# Patient Record
Sex: Female | Born: 1960 | Race: Black or African American | Hispanic: No | Marital: Married | Smoking: Former smoker
Health system: Southern US, Community
[De-identification: ages and names within clinical notes are randomized; demographics above are authoritative.]

## PROBLEM LIST (undated history)

## (undated) DIAGNOSIS — R945 Abnormal results of liver function studies: Secondary | ICD-10-CM

## (undated) DIAGNOSIS — E785 Hyperlipidemia, unspecified: Secondary | ICD-10-CM

## (undated) DIAGNOSIS — R7989 Other specified abnormal findings of blood chemistry: Secondary | ICD-10-CM

## (undated) DIAGNOSIS — R7309 Other abnormal glucose: Secondary | ICD-10-CM

## (undated) DIAGNOSIS — E781 Pure hyperglyceridemia: Secondary | ICD-10-CM

## (undated) DIAGNOSIS — G4730 Sleep apnea, unspecified: Secondary | ICD-10-CM

## (undated) DIAGNOSIS — E876 Hypokalemia: Secondary | ICD-10-CM

## (undated) DIAGNOSIS — I10 Essential (primary) hypertension: Secondary | ICD-10-CM

## (undated) DIAGNOSIS — R0602 Shortness of breath: Secondary | ICD-10-CM

## (undated) HISTORY — DX: Essential (primary) hypertension: I10

## (undated) HISTORY — DX: Abnormal results of liver function studies: R94.5

## (undated) HISTORY — DX: Hyperlipidemia, unspecified: E78.5

## (undated) HISTORY — DX: Pure hyperglyceridemia: E78.1

## (undated) HISTORY — DX: Other specified abnormal findings of blood chemistry: R79.89

## (undated) HISTORY — DX: Hypokalemia: E87.6

## (undated) HISTORY — DX: Other abnormal glucose: R73.09

## (undated) HISTORY — DX: Sleep apnea, unspecified: G47.30

---

## 2001-10-16 ENCOUNTER — Other Ambulatory Visit: Admission: RE | Admit: 2001-10-16 | Discharge: 2001-10-16 | Payer: Self-pay | Admitting: *Deleted

## 2003-04-09 ENCOUNTER — Other Ambulatory Visit: Admission: RE | Admit: 2003-04-09 | Discharge: 2003-04-09 | Payer: Self-pay | Admitting: Gynecology

## 2004-04-22 ENCOUNTER — Other Ambulatory Visit: Admission: RE | Admit: 2004-04-22 | Discharge: 2004-04-22 | Payer: Self-pay | Admitting: Gynecology

## 2004-06-10 ENCOUNTER — Ambulatory Visit (HOSPITAL_COMMUNITY): Admission: RE | Admit: 2004-06-10 | Discharge: 2004-06-10 | Payer: Self-pay | Admitting: Gastroenterology

## 2005-08-31 ENCOUNTER — Other Ambulatory Visit: Admission: RE | Admit: 2005-08-31 | Discharge: 2005-08-31 | Payer: Self-pay | Admitting: Obstetrics and Gynecology

## 2011-03-24 ENCOUNTER — Ambulatory Visit
Admission: RE | Admit: 2011-03-24 | Discharge: 2011-03-24 | Disposition: A | Discharge status: Home / Self Care | Payer: BC Managed Care – PPO | Source: Ambulatory Visit | Attending: Emergency Medicine | Admitting: Emergency Medicine

## 2011-03-24 ENCOUNTER — Other Ambulatory Visit: Payer: Self-pay | Admitting: Emergency Medicine

## 2011-03-24 DIAGNOSIS — R102 Pelvic and perineal pain: Secondary | ICD-10-CM

## 2011-04-06 LAB — HM PAP SMEAR: HM Pap smear: NORMAL

## 2011-04-24 LAB — LIPID PANEL
Cholesterol: 158 mg/dL (ref 0–200)
HDL: 39 mg/dL (ref 35–70)
Triglycerides: 175 mg/dL — AB (ref 40–160)

## 2011-04-24 LAB — HEPATIC FUNCTION PANEL: Alkaline Phosphatase: 92 U/L (ref 25–125)

## 2011-07-04 ENCOUNTER — Other Ambulatory Visit: Payer: Self-pay | Admitting: Emergency Medicine

## 2011-07-06 ENCOUNTER — Other Ambulatory Visit (INDEPENDENT_AMBULATORY_CARE_PROVIDER_SITE_OTHER): Payer: Self-pay | Admitting: General Surgery

## 2011-07-06 ENCOUNTER — Ambulatory Visit (INDEPENDENT_AMBULATORY_CARE_PROVIDER_SITE_OTHER): Payer: BC Managed Care – PPO | Admitting: General Surgery

## 2011-07-06 ENCOUNTER — Encounter: Payer: Self-pay | Admitting: *Deleted

## 2011-07-06 ENCOUNTER — Encounter: Payer: BC Managed Care – PPO | Attending: Emergency Medicine | Admitting: *Deleted

## 2011-07-06 ENCOUNTER — Encounter (INDEPENDENT_AMBULATORY_CARE_PROVIDER_SITE_OTHER): Payer: Self-pay | Admitting: General Surgery

## 2011-07-06 ENCOUNTER — Ambulatory Visit: Payer: Self-pay | Admitting: *Deleted

## 2011-07-06 DIAGNOSIS — E669 Obesity, unspecified: Secondary | ICD-10-CM | POA: Insufficient documentation

## 2011-07-06 DIAGNOSIS — Z713 Dietary counseling and surveillance: Secondary | ICD-10-CM | POA: Insufficient documentation

## 2011-07-06 NOTE — Progress Notes (Signed)
Subjective:   Obesity  Patient ID: Madison Alvarado, female   DOB: 28-Mar-1961, 50 y.o.   MRN: [id number]  HPI Patient is a pleasant 50 year old female here for consideration for surgical treatment for morbid obesity. She states that she has had progressive struggles with her weight through her entire adult life. She has been through numerous efforts at diet and exercise programs to lose weight and has lost up to 40 pounds at a time but then experiences progressive weight regain. She is considering surgery due to concerns about her deteriorating health. She has developed multiple comorbidities directly related to her weight as detailed below. She sees her health continuing to deteriorate if she does not get control of her weight and is very concerned about this. She has been to our initial information seminar and is opened options but at this point feels gastric bypass would fit her situation best.  Past Medical History  Diagnosis Date  . Asthma   . Hyperlipidemia   . Hypertension    History reviewed. No pertinent past surgical history. Current Outpatient Prescriptions  Medication Sig Dispense Refill  . albuterol (PROVENTIL) (2.5 MG/3ML) 0.083% nebulizer solution Take 2.5 mg by nebulization as needed.        Marland Kitchen amLODipine (NORVASC) 5 MG tablet       . aspirin 81 MG tablet Take 81 mg by mouth daily.        Marland Kitchen atenolol (TENORMIN) 100 MG tablet       . beclomethasone (QVAR) 40 MCG/ACT inhaler Inhale 2 puffs into the lungs daily.        . calcium carbonate 200 MG capsule Take 600 mg by mouth daily.        . chlorthalidone (HYGROTON) 25 MG tablet       . cholecalciferol (VITAMIN D) 400 UNITS TABS Take 1,000 Units by mouth daily.        Marland Kitchen ESZOPICLONE 3 MG tablet       . fish oil-omega-3 fatty acids 1000 MG capsule Take 2 g by mouth 3 (three) times daily.        Marland Kitchen ibuprofen (ADVIL,MOTRIN) 600 MG tablet       . NIASPAN 500 MG CR tablet       . potassium chloride SA (K-DUR,KLOR-CON) 20 MEQ tablet Take 20  mEq by mouth daily.        . pravastatin (PRAVACHOL) 40 MG tablet       . vitamin E 200 UNIT capsule Take 200 Units by mouth daily.         No Known Allergies History  Substance Use Topics  . Smoking status: Former Smoker    Quit date: 06/16/2011  . Smokeless tobacco: Never Used  . Alcohol Use: No    Review of Systems  Constitutional: Positive for fatigue.  HENT: Negative.   Eyes: Negative.   Respiratory: Positive for shortness of breath and wheezing.   Cardiovascular: Negative.   Gastrointestinal: Negative.   Musculoskeletal: Positive for arthralgias.  Hematological: Negative.        Objective:   Physical Exam General: Morbidly obese African American female in no distress Skin: Warm and dry without rash or infection HEENT: No palpable masses or thyromegaly. Sclera nonicteric. Oropharynx clear. Pupils equal round and reactive. Lungs: Breath sounds clear and equal Lymph nodes: No cervical, supraclavicular, or equal nodes palpable Cardiovascular: Regular rate and rhythm without murmur. No JVD or peripheral edema. Peripheral pulses intact. Abdomen: Obese. Soft and nontender without palpable masses, organomegaly, or hernias. Extremities:  No joint swelling or edema or chronic venous stasis changes Neurologic: Alert and fully oriented. Gait normal.    Assessment:     50 year old female with progressive morbid obesity unresponsive to multiple efforts at medical management. She presents at a BMI of 41.27 with multiple comorbidities including obstructive sleep apnea requiring CPAP, hypertension, dyslipidemia, borderline diabetes diet controlled, and asthma. She has significant potential benefits if we can get her down to a healthier weight. We discussed surgical options available including lap band, sleeve gastrectomy, and gastric bypass. These operations were compared regarding results and potential complications. She continues to feel that gastric bypass would be her best choice. We  discussed this procedure in detail including its nature and expected recovery, expected results, risks of anesthetic complications, bleeding, leak and infection, pulmonary embolus, risk of death, and long-term complications of stenosis, ulcer, weight regain, metabolic and nutritionalt nutritional deficiencies, internal hernia, gallstones and others. She was given a complete consent form to review.    Plan:     We will proceed with preoperative workup including psychologic and nutrition evaluation, routine blood work, H. pylori testing, bone density. She is already scheduled for an abdominal ultrasound by her PCP. I will see her back following these studies.

## 2011-07-06 NOTE — Progress Notes (Signed)
  Medical Nutrition Therapy:  Appt start time: 1100 end time:  1230.   Assessment:  Primary concerns today: Nutrition counseling for pre-diabetes, weight loss and hyperlipidemia. Patient states she has started water aerobics at the [facility] 4 evenings per week and enjoys that quite a bit! She has modified her beverage intake away from regular sodas to diet and green tea. 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   MEDICATIONS: see list.   DIETARY INTAKE:  Usual eating pattern includes 2 meals and 1-2 snacks per day.  Everyday foods include good variety of all food groups.  Avoided foods include regular soda and fried foods now.    24-hr recall:  B ( AM): skips  Snk ( AM): banana  L ( PM): left overs or sit down restaurant meal with lean meat, starch and vegetables Snk ( PM): occasional fruit D ( PM): meat, starch and usually a vegetable  Snk ( PM): none stated Beverages: diet soda, diet green tea or water. Occasionally a fruit juice  Usual physical activity: water aerobics for 45 minutes 4 nights a week for the past 2 weeks  Estimated energy needs: 1600 calories 180 g carbohydrates 120 g protein 44 g fat  Progress Towards Goal(s):  In progress.   Nutritional Diagnosis:  Worth-3.3 Overweight/obesity As related to pre-diabetes, hypercholesterolemia and HTN.  As evidenced by BMI of 41.3.    Intervention:  Nutrition counseling provided for consistent carb intake of 3 Carb Choices per meal, 0-1/snack for total of 1600 calories to allow weight loss of 1-2 pounds per week. Also recommended frequent choice of lean meats, low fat dairy products and vegetable oil dressings. Encouraged continuation of water aerobics, and provided instruction for Accu-Chek Nano meter kit. (Lot # [id number], Exp date 09/04/2012) Suggested she test twice a week, alternating between pre and post meal as long as results were within target ranges. If BG increases, she should contact me or her MD for  instructions.  Handouts given during visit include:  Where Do I Begin with Type 2 Diabetes by ADA  Carb Counting and Meal Planning by NovoNordisk  Monitoring/Evaluation:  Dietary intake, exercise, self monitoring of BG logs, and body weight in 4 week(s).

## 2011-07-07 ENCOUNTER — Ambulatory Visit
Admission: RE | Admit: 2011-07-07 | Discharge: 2011-07-07 | Disposition: A | Discharge status: Home / Self Care | Payer: BC Managed Care – PPO | Source: Ambulatory Visit | Attending: Emergency Medicine | Admitting: Emergency Medicine

## 2011-07-07 LAB — CBC WITH DIFFERENTIAL/PLATELET
Basophils Relative: 1 % (ref 0–1)
Eosinophils Absolute: 0.2 10*3/uL (ref 0.0–0.7)
Eosinophils Relative: 4 % (ref 0–5)
HCT: 37.1 % (ref 36.0–46.0)
Hemoglobin: 12.5 g/dL (ref 12.0–15.0)
Lymphs Abs: 2.6 10*3/uL (ref 0.7–4.0)
MCH: 30.4 pg (ref 26.0–34.0)
MCHC: 33.7 g/dL (ref 30.0–36.0)
MCV: 90.3 fL (ref 78.0–100.0)
Monocytes Absolute: 0.4 10*3/uL (ref 0.1–1.0)
Monocytes Relative: 9 % (ref 3–12)
RBC: 4.11 MIL/uL (ref 3.87–5.11)

## 2011-07-07 LAB — T4: T4, Total: 7.1 ug/dL (ref 5.0–12.5)

## 2011-07-07 LAB — TSH: TSH: 1.998 u[IU]/mL (ref 0.350–4.500)

## 2011-07-07 LAB — LIPID PANEL
Cholesterol: 156 mg/dL (ref 0–200)
HDL: 38 mg/dL — ABNORMAL LOW (ref >39)

## 2011-07-07 LAB — COMPREHENSIVE METABOLIC PANEL
BUN: 12 mg/dL (ref 6–23)
CO2: 25 mEq/L (ref 19–32)
Glucose, Bld: 103 mg/dL — ABNORMAL HIGH (ref 70–99)
Sodium: 140 mEq/L (ref 135–145)
Total Bilirubin: 0.5 mg/dL (ref 0.3–1.2)
Total Protein: 7.3 g/dL (ref 6.0–8.3)

## 2011-07-08 ENCOUNTER — Other Ambulatory Visit (INDEPENDENT_AMBULATORY_CARE_PROVIDER_SITE_OTHER): Payer: Self-pay | Admitting: General Surgery

## 2011-07-08 LAB — HEMOGLOBIN A1C
Hgb A1c MFr Bld: 5.9 % — ABNORMAL HIGH
Mean Plasma Glucose: 123 mg/dL — ABNORMAL HIGH

## 2011-07-13 ENCOUNTER — Ambulatory Visit (HOSPITAL_COMMUNITY): Payer: BC Managed Care – PPO

## 2011-07-13 ENCOUNTER — Encounter (HOSPITAL_COMMUNITY)
Admission: RE | Disposition: A | Discharge status: Home / Self Care | Payer: Self-pay | Source: Ambulatory Visit | Attending: General Surgery

## 2011-07-13 ENCOUNTER — Ambulatory Visit (HOSPITAL_COMMUNITY)
Admission: RE | Admit: 2011-07-13 | Discharge: 2011-07-13 | Disposition: A | Discharge status: Home / Self Care | Payer: BC Managed Care – PPO | Source: Ambulatory Visit | Attending: General Surgery | Admitting: General Surgery

## 2011-07-13 ENCOUNTER — Other Ambulatory Visit: Payer: Self-pay

## 2011-07-13 DIAGNOSIS — Z01818 Encounter for other preprocedural examination: Secondary | ICD-10-CM | POA: Insufficient documentation

## 2011-07-13 HISTORY — PX: BREATH TEK H PYLORI: SHX5422

## 2011-07-13 SURGERY — BREATH TEST, FOR HELICOBACTER PYLORI
Anesthesia: Choice

## 2011-07-14 ENCOUNTER — Encounter (HOSPITAL_COMMUNITY): Payer: Self-pay

## 2011-07-18 ENCOUNTER — Encounter: Payer: Self-pay | Admitting: *Deleted

## 2011-07-18 ENCOUNTER — Encounter: Payer: BC Managed Care – PPO | Attending: Emergency Medicine | Admitting: *Deleted

## 2011-07-18 DIAGNOSIS — Z713 Dietary counseling and surveillance: Secondary | ICD-10-CM | POA: Insufficient documentation

## 2011-07-18 DIAGNOSIS — E669 Obesity, unspecified: Secondary | ICD-10-CM | POA: Insufficient documentation

## 2011-07-18 NOTE — Progress Notes (Signed)
  Pre-Op Assessment Visit: Pre-Operative Gastric Bypass Surgery  Medical Nutrition Therapy:  Appt start time: 0830 end time:  0930.  Patient was seen on 07/18/2011 for Pre-Operative Gastric Bypass Nutrition Assessment. Assessment and letter of approval faxed to [facility] Surgery Bariatric Surgery Program coordinator on 07/18/2011.  Approval letter sent to [facility] Scan center and will be available in the chart under the media tab.  Handouts given during visit include:  Pre-Op Goals Handout  Patient to call for Pre-Op and Post-Op Nutrition Education at [facility] when surgery is scheduled.

## 2011-07-18 NOTE — Patient Instructions (Signed)
   Follow Pre-Op Nutrition Goals to prepare for Gastric Bypass Surgery.   Call the [facility] at [phone] once you have been given your surgery date to enrolled in the Pre-Op Nutrition Class. You will need to attend this nutrition class 3-4 weeks prior to your surgery. 

## 2011-07-29 ENCOUNTER — Encounter (HOSPITAL_COMMUNITY): Payer: Self-pay | Admitting: General Surgery

## 2011-08-05 ENCOUNTER — Other Ambulatory Visit: Payer: Self-pay | Admitting: Emergency Medicine

## 2011-08-08 ENCOUNTER — Other Ambulatory Visit: Payer: Self-pay | Admitting: Emergency Medicine

## 2011-08-08 DIAGNOSIS — M79605 Pain in left leg: Secondary | ICD-10-CM

## 2011-08-10 ENCOUNTER — Ambulatory Visit: Payer: Self-pay | Admitting: *Deleted

## 2011-08-12 ENCOUNTER — Ambulatory Visit
Admission: RE | Admit: 2011-08-12 | Discharge: 2011-08-12 | Disposition: A | Discharge status: Home / Self Care | Payer: BC Managed Care – PPO | Source: Ambulatory Visit | Attending: Emergency Medicine | Admitting: Emergency Medicine

## 2011-08-12 DIAGNOSIS — M79604 Pain in right leg: Secondary | ICD-10-CM

## 2011-09-16 ENCOUNTER — Other Ambulatory Visit (INDEPENDENT_AMBULATORY_CARE_PROVIDER_SITE_OTHER): Payer: Self-pay | Admitting: General Surgery

## 2011-09-21 NOTE — Patient Instructions (Signed)
Follow:    Pre-Op Diet per MD 2 weeks prior to surgery  Phase 2- Liquids (clear/full) 2 weeks after surgery  Vitamin/Mineral/Calcium guidelines for purchasing bariatric supplements  Exercise guidelines pre and post-op per MD  Follow-up at [facility] in 2 weeks post-op for diet advancement. Contact Khalaya Mcgurn as needed with questions/concerns.  

## 2011-09-21 NOTE — Progress Notes (Signed)
  Bariatric Class:  Appt start time: 0830 end time:  0930.  Pre-Operative Nutrition Class  Patient was seen on 09/21/10 for Pre-Operative Bariatric Surgery Education at the [facility].  Surgery date: 10/17/11 Surgery type: Gastric Bypass  Weight today: None taken Last weight: 264.4lbs (07/18/11) BMI: 41.4  Samples given per MNT protocol: Bariatric Advantage Multivitamin Lot # [id number] Exp: 9/13  Bariatric Advantage Calcium Citrate Lot # [id number] Exp: 12/13  Celebrate Vitamins B12 ( ) Lot # [id number] Exp: 7/14  Celebrate Vitamins Multivitamin Lot # [id number] Exp:6/14  Celebrate VitaminsCalcium Citrate Lot # [id number] Exp: 5/14  Unjury Protein - Chicken Soup Lot # [id number] Exp: 8/13  The following the learning objective met by the patient during this course:   Identifies Pre-Op Dietary Goals and will begin 2 weeks pre-operatively   Identifies appropriate sources of fluids and proteins   States protein recommendations and appropriate sources pre and post-operatively  Identifies Post-Operative Dietary Goals and will follow for 2 weeks post-operatively  Identifies appropriate multivitamin and calcium sources  Describes the need for physical activity post-operatively and will follow MD recommendations  States when to call healthcare provider regarding medication questions or post-operative complications  Handouts given during class include:  Pre-Op Bariatric Surgery Diet Handout  Protein Shake Handout  Post-Op Bariatric Surgery Nutrition Handout  BELT Program Information Flyer  Support Group Information Flyer  Follow-Up Plan: Patient will follow-up at [facility] 2 weeks post operatively for diet advancement per MD.

## 2011-09-22 ENCOUNTER — Encounter: Payer: BC Managed Care – PPO | Attending: Emergency Medicine

## 2011-09-22 DIAGNOSIS — Z713 Dietary counseling and surveillance: Secondary | ICD-10-CM | POA: Insufficient documentation

## 2011-09-22 DIAGNOSIS — E669 Obesity, unspecified: Secondary | ICD-10-CM | POA: Insufficient documentation

## 2011-09-26 ENCOUNTER — Encounter (INDEPENDENT_AMBULATORY_CARE_PROVIDER_SITE_OTHER): Payer: BC Managed Care – PPO

## 2011-09-26 ENCOUNTER — Other Ambulatory Visit: Payer: Self-pay | Admitting: Emergency Medicine

## 2011-09-26 DIAGNOSIS — Z01818 Encounter for other preprocedural examination: Secondary | ICD-10-CM

## 2011-09-26 DIAGNOSIS — I10 Essential (primary) hypertension: Secondary | ICD-10-CM

## 2011-09-26 LAB — COMPLETE METABOLIC PANEL WITH GFR
ALT: 63 U/L — ABNORMAL HIGH (ref 0–35)
CO2: 28 mEq/L (ref 19–32)
Creat: 0.88 mg/dL (ref 0.50–1.10)
GFR, Est African American: 88 mL/min
GFR, Est Non African American: 76 mL/min
Glucose, Bld: 88 mg/dL (ref 70–99)
Total Bilirubin: 0.5 mg/dL (ref 0.3–1.2)

## 2011-09-26 LAB — LIPID PANEL
HDL: 41 mg/dL (ref >39)
Triglycerides: 148 mg/dL (ref <150)

## 2011-10-03 LAB — HTLV I+II ANTIBODIES, (EIA), BLD

## 2011-10-06 ENCOUNTER — Telehealth: Payer: Self-pay

## 2011-10-06 ENCOUNTER — Encounter (HOSPITAL_COMMUNITY): Payer: Self-pay | Admitting: Pharmacy Technician

## 2011-10-06 NOTE — Telephone Encounter (Signed)
Patient would like a call back regarding her lab results from 09/26/11.

## 2011-10-10 NOTE — Telephone Encounter (Signed)
Left message on machine for patient to call back and speak with nurse.

## 2011-10-11 ENCOUNTER — Encounter: Payer: Self-pay | Admitting: *Deleted

## 2011-10-11 DIAGNOSIS — R7309 Other abnormal glucose: Secondary | ICD-10-CM | POA: Insufficient documentation

## 2011-10-11 DIAGNOSIS — I10 Essential (primary) hypertension: Secondary | ICD-10-CM | POA: Insufficient documentation

## 2011-10-11 DIAGNOSIS — E785 Hyperlipidemia, unspecified: Secondary | ICD-10-CM | POA: Insufficient documentation

## 2011-10-11 DIAGNOSIS — J45909 Unspecified asthma, uncomplicated: Secondary | ICD-10-CM | POA: Insufficient documentation

## 2011-10-11 DIAGNOSIS — G4730 Sleep apnea, unspecified: Secondary | ICD-10-CM

## 2011-10-11 NOTE — Telephone Encounter (Signed)
Pt had LM to call her home # back and leave message with lab results. LMOM for pt with results that have come back and let her know one test still pending and we will be back in touch after it comes in.

## 2011-10-13 ENCOUNTER — Encounter (HOSPITAL_COMMUNITY)
Admission: RE | Admit: 2011-10-13 | Discharge: 2011-10-13 | Disposition: A | Discharge status: Home / Self Care | Payer: BC Managed Care – PPO | Source: Ambulatory Visit | Attending: General Surgery | Admitting: General Surgery

## 2011-10-13 ENCOUNTER — Encounter (HOSPITAL_COMMUNITY): Payer: Self-pay

## 2011-10-13 ENCOUNTER — Telehealth (INDEPENDENT_AMBULATORY_CARE_PROVIDER_SITE_OTHER): Payer: Self-pay

## 2011-10-13 ENCOUNTER — Encounter (INDEPENDENT_AMBULATORY_CARE_PROVIDER_SITE_OTHER): Payer: Self-pay | Admitting: General Surgery

## 2011-10-13 ENCOUNTER — Ambulatory Visit (INDEPENDENT_AMBULATORY_CARE_PROVIDER_SITE_OTHER): Payer: BC Managed Care – PPO | Admitting: General Surgery

## 2011-10-13 HISTORY — DX: Shortness of breath: R06.02

## 2011-10-13 LAB — SURGICAL PCR SCREEN: MRSA, PCR: NEGATIVE

## 2011-10-13 LAB — COMPREHENSIVE METABOLIC PANEL
BUN: 24 mg/dL — ABNORMAL HIGH (ref 6–23)
CO2: 28 mEq/L (ref 19–32)
Calcium: 10.6 mg/dL — ABNORMAL HIGH (ref 8.4–10.5)
Creatinine, Ser: 0.85 mg/dL (ref 0.50–1.10)
GFR calc Af Amer: >90 mL/min (ref >90)
GFR calc non Af Amer: 78 mL/min — ABNORMAL LOW (ref >90)
Glucose, Bld: 126 mg/dL — ABNORMAL HIGH (ref 70–99)

## 2011-10-13 LAB — DIFFERENTIAL
Basophils Absolute: 0 10*3/uL (ref 0.0–0.1)
Lymphocytes Relative: 58 % — ABNORMAL HIGH (ref 12–46)
Neutro Abs: 1.9 10*3/uL (ref 1.7–7.7)

## 2011-10-13 LAB — CBC
Hemoglobin: 13.5 g/dL (ref 12.0–15.0)
MCH: 29.9 pg (ref 26.0–34.0)
MCV: 88.1 fL (ref 78.0–100.0)
RBC: 4.52 MIL/uL (ref 3.87–5.11)

## 2011-10-13 LAB — HTLV I AND II, WESTERN BLOT
GD21: REACTIVE
P24: REACTIVE
RGP46 I: REACTIVE

## 2011-10-13 NOTE — Pre-Procedure Instructions (Signed)
Notified Nikki/portable of need for bariatric bed/trapeze day of surgery

## 2011-10-13 NOTE — Pre-Procedure Instructions (Signed)
Patient states that has ATENOLOL tabs at home- states the drug store had to fill atenolol and fluid pills seperately due to manufacturer-  Instructed to take Atenolol AM of surgery

## 2011-10-13 NOTE — Telephone Encounter (Signed)
Pre admit calling to notify Dr Johna Sheriff that pt's BUN is abnormal for surgery.

## 2011-10-13 NOTE — Patient Instructions (Signed)
20 Madison Alvarado  10/13/2011   Your procedure is scheduled on:  10/17/11   Surgery [id number]   Monday  Report to [facility] at  0915     AM.  Call this number if you have problems the morning of surgery: [phone]     Or PST   [phone]  Madison Alvarado               BRING INHALERS WITH YOU TO HOSPITAL/ BRING CPAP MASK WITH YOU TO HOSPITAL  Remember:   Do not eat food:After Midnight. Saturday night  May have clear liquids: clears on Sunday- DRINK UP UNTIL MIDNIGHT OR BEDTIME Sunday-    BOWEL PREP AS PER OFFICE   Clear liquids include soda, tea, black coffee, apple or grape juice, broth.  Take these medicines the morning of surgery with A SIP OF WATER:   Norvasc with sip water,   qvar               May take Claritin, or inhalers if needed             No aspirin, antiinflammatories or vitamins x 7 days pre op/   No hormones x 4 weeks  Do not wear jewelry, make-up or nail polish.  Do not wear lotions, powders, or perfumes. You may wear deodorant.  Do not shave 48 hours prior to surgery.  Do not bring valuables to the hospital.  Contacts, dentures or bridgework may not be worn into surgery.  Leave suitcase in the car. After surgery it may be brought to your room.  For patients admitted to the hospital, checkout time is 11:00 AM the day of discharge.   Patients discharged the day of surgery will not be allowed to drive home.  Name and phone number of your driver:    husband                                                                  Special Instructions: CHG Shower Use Special Wash: 1/2 bottle night before surgery and 1/2 bottle morning of surgery. REGULAR SOAP FACE AND PRIVATES              LADIES- NO SHAVING 48 HOURS BEFORE USING BETASEPT SOAP.                 MEN-MAY SHAVE FACE MORNING OF SURGERY  Please read over the following fact sheets that you were given: MRSA Information

## 2011-10-13 NOTE — Pre-Procedure Instructions (Signed)
1640- notified Madison Alvarado at [facility] of abnormal BUN

## 2011-10-13 NOTE — Progress Notes (Signed)
Subjective:   For Roux-en-Y gastric bypass  Patient ID: Madison Alvarado, female   DOB: 03/09/1961, 50 y.o.   MRN: [id number]  HPI Patient returns for a preop visit prior to planned laparoscopic Roux-en-Y gastric bypass. She has successfully completed her psych and nutrition evaluations. We reviewed her preoperative workup. Ultrasound showed some hepatic steatosis but was otherwise negative. She has mildly elevated transaminases. Mildly elevated lipids. The remainder of her lab work was unremarkable. We reviewed the procedure again today. We went through the consent form completely and all her questions were answered. Past Medical History  Diagnosis Date  . Asthma   . Hypertension   . Sleep apnea   . Hyperlipidemia   . Hypertriglyceridemia   . Elevated LFTs   . Elevated glucose   . Low blood potassium    Past Surgical History  Procedure Date  . Breath tek h pylori 07/13/2011    Procedure: BREATH TEK H PYLORI;  Surgeon: Macey Wurtz T Nuno Brubacher, MD;  Location: [facility];  Service: General;  Laterality: N/A;   Current Outpatient Prescriptions  Medication Sig Dispense Refill  . amLODipine (NORVASC) 5 MG tablet Take 5 mg by mouth every morning.       . atenolol-chlorthalidone (TENORETIC) 100-25 MG per tablet Take 1 tablet by mouth daily before lunch.      . NIASPAN 500 MG CR tablet Take 500 mg by mouth at bedtime.       . potassium chloride SA (K-DUR,KLOR-CON) 20 MEQ tablet Take 20 mEq by mouth daily.        . pravastatin (PRAVACHOL) 40 MG tablet Take 40 mg by mouth at bedtime.       . albuterol (PROVENTIL HFA;VENTOLIN HFA) 108 (90 BASE) MCG/ACT inhaler Inhale 2 puffs into the lungs every 6 (six) hours as needed. For shortness of breath      . aspirin 81 MG tablet Take 81 mg by mouth every morning.       . beclomethasone (QVAR) 40 MCG/ACT inhaler Inhale 2 puffs into the lungs 2 (two) times daily.       . Biotin 3 MG TABS Take 3 mg by mouth daily.      . calcium citrate-vitamin D (CITRACAL+D)  315-200 MG-UNIT per tablet Take 1 tablet by mouth daily.      . cholecalciferol (VITAMIN D) 1000 UNITS tablet Take 1,000 Units by mouth daily.      . ESZOPICLONE 3 MG tablet Take 3 mg by mouth at bedtime as needed. SLEEP      . fluticasone (FLONASE) 50 MCG/ACT nasal spray Place 2 sprays into the nose daily as needed. For allergies      . ibuprofen (ADVIL,MOTRIN) 600 MG tablet Ad lib.      . loratadine (CLARITIN) 10 MG tablet Take 10 mg by mouth daily as needed. For allergies      . Multiple Vitamin (MULITIVITAMIN WITH MINERALS) TABS Take 1 tablet by mouth daily.      . Omega-3 Fatty Acids (FISH OIL) 1000 MG CAPS Take 1,000 mg by mouth 3 (three) times daily.      . vitamin E 200 UNIT capsule Take 200 Units by mouth daily.         No Known Allergies History  Substance Use Topics  . Smoking status: Former Smoker    Quit date: 06/16/2011  . Smokeless tobacco: Never Used  . Alcohol Use: No     Review of Systems  Constitutional: Negative for fever, chills and fatigue.  Respiratory: Negative.     Cardiovascular: Negative.   Gastrointestinal: Negative.        Objective:   Physical Exam General: Alert,Morbidly obese African American female in no distress Skin: Warm and dry without rash or infection. HEENT: No palpable masses or thyromegaly. Sclera nonicteric. Pupils equal round and reactive. Oropharynx clear. Lymph nodes: No cervical, supraclavicular, or inguinal nodes palpable. Lungs: Breath sounds clear and equal without increased work of breathing Cardiovascular: Regular rate and rhythm without murmur. No JVD or edema. Peripheral pulses intact. Abdomen: Nondistended. Soft and nontender. No masses palpable. No organomegaly. No palpable hernias. Extremities: No edema or joint swelling or deformity. No chronic venous stasis changes. Neurologic: Alert and fully oriented. Gait normal.     Assessment:     50-year-old female with progressive morbid obesity unresponsive to medical  management and multiple comorbidities including obstructive sleep apnea requiring CPAP, hypertension, dyslipidemia, borderline diabetes diet controlled, and asthma. Her preoperative workup is complete and she understands the procedure and all her questions have been answered.    Plan:      For laparoscopic Roux-en-Y gastric bypass      

## 2011-10-17 ENCOUNTER — Inpatient Hospital Stay (HOSPITAL_COMMUNITY)
Admission: RE | Admit: 2011-10-17 | Discharge: 2011-10-19 | DRG: 288 | Disposition: A | Discharge status: Home / Self Care | Payer: BC Managed Care – PPO | Source: Ambulatory Visit | Attending: General Surgery | Admitting: General Surgery

## 2011-10-17 ENCOUNTER — Encounter (HOSPITAL_COMMUNITY)
Admission: RE | Disposition: A | Discharge status: Home / Self Care | Payer: Self-pay | Source: Ambulatory Visit | Attending: General Surgery

## 2011-10-17 ENCOUNTER — Encounter (HOSPITAL_COMMUNITY): Payer: Self-pay | Admitting: Anesthesiology

## 2011-10-17 ENCOUNTER — Encounter (HOSPITAL_COMMUNITY): Payer: Self-pay | Admitting: *Deleted

## 2011-10-17 ENCOUNTER — Inpatient Hospital Stay (HOSPITAL_COMMUNITY): Payer: BC Managed Care – PPO | Admitting: Anesthesiology

## 2011-10-17 DIAGNOSIS — I10 Essential (primary) hypertension: Secondary | ICD-10-CM

## 2011-10-17 DIAGNOSIS — E781 Pure hyperglyceridemia: Secondary | ICD-10-CM | POA: Diagnosis present

## 2011-10-17 DIAGNOSIS — Z6841 Body Mass Index (BMI) 40.0 and over, adult: Secondary | ICD-10-CM

## 2011-10-17 DIAGNOSIS — R7401 Elevation of levels of liver transaminase levels: Secondary | ICD-10-CM | POA: Diagnosis present

## 2011-10-17 DIAGNOSIS — R7402 Elevation of levels of lactic acid dehydrogenase (LDH): Secondary | ICD-10-CM | POA: Diagnosis present

## 2011-10-17 DIAGNOSIS — E785 Hyperlipidemia, unspecified: Secondary | ICD-10-CM | POA: Diagnosis present

## 2011-10-17 DIAGNOSIS — R7989 Other specified abnormal findings of blood chemistry: Secondary | ICD-10-CM | POA: Diagnosis present

## 2011-10-17 DIAGNOSIS — J45909 Unspecified asthma, uncomplicated: Secondary | ICD-10-CM

## 2011-10-17 DIAGNOSIS — Z01812 Encounter for preprocedural laboratory examination: Secondary | ICD-10-CM

## 2011-10-17 DIAGNOSIS — G4730 Sleep apnea, unspecified: Secondary | ICD-10-CM | POA: Diagnosis present

## 2011-10-17 HISTORY — PX: ROUX-EN-Y PROCEDURE: SUR1287

## 2011-10-17 HISTORY — PX: GASTRIC ROUX-EN-Y: SHX5262

## 2011-10-17 LAB — HEMOGLOBIN AND HEMATOCRIT, BLOOD: Hemoglobin: 13 g/dL (ref 12.0–15.0)

## 2011-10-17 SURGERY — LAPAROSCOPIC ROUX-EN-Y GASTRIC
Anesthesia: General | Site: Abdomen | Wound class: Clean Contaminated

## 2011-10-17 MED ORDER — HEPARIN SODIUM (PORCINE) 5000 UNIT/ML IJ SOLN
5000.0000 [IU] | Freq: Three times a day (TID) | INTRAMUSCULAR | Status: DC
  Administered 2011-10-18 – 2011-10-19 (×4): 5000 [IU] via SUBCUTANEOUS
  Filled 2011-10-17 (×7): qty 1

## 2011-10-17 MED ORDER — HEPARIN SODIUM (PORCINE) 5000 UNIT/ML IJ SOLN
5000.0000 [IU] | INTRAMUSCULAR | Status: AC
  Administered 2011-10-17: 5000 [IU] via SUBCUTANEOUS

## 2011-10-17 MED ORDER — METOPROLOL TARTRATE 1 MG/ML IV SOLN
10.0000 mg | Freq: Four times a day (QID) | INTRAVENOUS | Status: DC
  Administered 2011-10-18 – 2011-10-19 (×4): 10 mg via INTRAVENOUS
  Filled 2011-10-17 (×9): qty 10

## 2011-10-17 MED ORDER — NEOSTIGMINE METHYLSULFATE 1 MG/ML IJ SOLN
INTRAMUSCULAR | Status: DC | PRN
  Administered 2011-10-17: 5 mg via INTRAVENOUS

## 2011-10-17 MED ORDER — UNJURY CHICKEN SOUP POWDER: 2.0000 [oz_av] | Freq: Four times a day (QID) | ORAL | Status: DC

## 2011-10-17 MED ORDER — BUPIVACAINE-EPINEPHRINE 0.25% -1:200000 IJ SOLN
INTRAMUSCULAR | Status: DC | PRN
  Administered 2011-10-17: 30 mL

## 2011-10-17 MED ORDER — UNJURY CHOCOLATE CLASSIC POWDER
2.0000 [oz_av] | Freq: Four times a day (QID) | ORAL | Status: DC
  Administered 2011-10-19: 2 [oz_av] via ORAL

## 2011-10-17 MED ORDER — HYDROMORPHONE HCL PF 1 MG/ML IJ SOLN: 0.2500 mg | INTRAMUSCULAR | Status: DC | PRN

## 2011-10-17 MED ORDER — PROPOFOL 10 MG/ML IV EMUL
INTRAVENOUS | Status: DC | PRN
  Administered 2011-10-17: 250 mg via INTRAVENOUS

## 2011-10-17 MED ORDER — ACETAMINOPHEN 160 MG/5ML PO SOLN: 650.0000 mg | ORAL | Status: DC | PRN

## 2011-10-17 MED ORDER — DEXAMETHASONE SODIUM PHOSPHATE 10 MG/ML IJ SOLN
INTRAMUSCULAR | Status: DC | PRN
  Administered 2011-10-17: 10 mg via INTRAVENOUS

## 2011-10-17 MED ORDER — 0.9 % SODIUM CHLORIDE (POUR BTL) OPTIME
TOPICAL | Status: DC | PRN
  Administered 2011-10-17: 1000 mL

## 2011-10-17 MED ORDER — ALBUTEROL SULFATE HFA 108 (90 BASE) MCG/ACT IN AERS
2.0000 | INHALATION_SPRAY | Freq: Four times a day (QID) | RESPIRATORY_TRACT | Status: DC | PRN
  Filled 2011-10-17: qty 6.7

## 2011-10-17 MED ORDER — OXYCODONE-ACETAMINOPHEN 5-325 MG/5ML PO SOLN
5.0000 mL | ORAL | Status: DC | PRN
  Administered 2011-10-18 – 2011-10-19 (×3): 5 mL via ORAL
  Filled 2011-10-17 (×3): qty 5

## 2011-10-17 MED ORDER — VITAMINS A & D EX OINT
TOPICAL_OINTMENT | CUTANEOUS | Status: AC
  Administered 2011-10-17: 1
  Filled 2011-10-17: qty 10

## 2011-10-17 MED ORDER — LACTATED RINGERS IV SOLN
INTRAVENOUS | Status: DC
  Administered 2011-10-17 (×2): via INTRAVENOUS

## 2011-10-17 MED ORDER — SUCCINYLCHOLINE CHLORIDE 20 MG/ML IJ SOLN
INTRAMUSCULAR | Status: DC | PRN
  Administered 2011-10-17: 100 mg via INTRAVENOUS

## 2011-10-17 MED ORDER — MIDAZOLAM HCL 5 MG/5ML IJ SOLN
INTRAMUSCULAR | Status: DC | PRN
  Administered 2011-10-17 (×2): 1 mg via INTRAVENOUS

## 2011-10-17 MED ORDER — UNJURY VANILLA POWDER: 2.0000 [oz_av] | Freq: Four times a day (QID) | ORAL | Status: DC

## 2011-10-17 MED ORDER — BUPIVACAINE HCL (PF) 0.25 % IJ SOLN
INTRAMUSCULAR | Status: AC
  Filled 2011-10-17: qty 30

## 2011-10-17 MED ORDER — TISSEEL VH 10 ML EX KIT
PACK | CUTANEOUS | Status: DC | PRN
  Administered 2011-10-17: 1

## 2011-10-17 MED ORDER — LACTATED RINGERS IV SOLN: INTRAVENOUS | Status: DC

## 2011-10-17 MED ORDER — POTASSIUM CHLORIDE IN NACL 20-0.45 MEQ/L-% IV SOLN
INTRAVENOUS | Status: DC
  Administered 2011-10-17 – 2011-10-18 (×3): via INTRAVENOUS
  Filled 2011-10-17 (×7): qty 1000

## 2011-10-17 MED ORDER — FLUTICASONE PROPIONATE 50 MCG/ACT NA SUSP
2.0000 | Freq: Every day | NASAL | Status: DC | PRN
  Filled 2011-10-17: qty 16

## 2011-10-17 MED ORDER — ACETAMINOPHEN 10 MG/ML IV SOLN
INTRAVENOUS | Status: DC | PRN
  Administered 2011-10-17: 1000 mg via INTRAVENOUS

## 2011-10-17 MED ORDER — FIBRIN SEALANT COMPONENT 5 ML EX KIT
PACK | CUTANEOUS | Status: AC
  Filled 2011-10-17: qty 2

## 2011-10-17 MED ORDER — MORPHINE SULFATE 2 MG/ML IJ SOLN
2.0000 mg | INTRAMUSCULAR | Status: DC | PRN
  Administered 2011-10-17: 2 mg via INTRAVENOUS
  Administered 2011-10-17 – 2011-10-18 (×8): 4 mg via INTRAVENOUS
  Filled 2011-10-17 (×7): qty 2
  Filled 2011-10-17: qty 1
  Filled 2011-10-17: qty 2

## 2011-10-17 MED ORDER — GLYCOPYRROLATE 0.2 MG/ML IJ SOLN
INTRAMUSCULAR | Status: DC | PRN
  Administered 2011-10-17: .8 mg via INTRAVENOUS

## 2011-10-17 MED ORDER — LACTATED RINGERS IR SOLN
Status: DC | PRN
  Administered 2011-10-17: 3000 mL

## 2011-10-17 MED ORDER — ONDANSETRON HCL 4 MG/2ML IJ SOLN
INTRAMUSCULAR | Status: DC | PRN
  Administered 2011-10-17: 4 mg via INTRAVENOUS

## 2011-10-17 MED ORDER — PROMETHAZINE HCL 25 MG/ML IJ SOLN: 6.2500 mg | INTRAMUSCULAR | Status: DC | PRN

## 2011-10-17 MED ORDER — ROCURONIUM BROMIDE 100 MG/10ML IV SOLN
INTRAVENOUS | Status: DC | PRN
  Administered 2011-10-17: 10 mg via INTRAVENOUS
  Administered 2011-10-17: 50 mg via INTRAVENOUS
  Administered 2011-10-17 (×2): 10 mg via INTRAVENOUS

## 2011-10-17 MED ORDER — FENTANYL CITRATE 0.05 MG/ML IJ SOLN
INTRAMUSCULAR | Status: DC | PRN
  Administered 2011-10-17 (×5): 50 ug via INTRAVENOUS

## 2011-10-17 MED ORDER — DEXTROSE 5 % IV SOLN
2.0000 g | INTRAVENOUS | Status: AC
  Administered 2011-10-17: 2 g via INTRAVENOUS
  Filled 2011-10-17: qty 2

## 2011-10-17 MED ORDER — LIDOCAINE HCL (CARDIAC) 20 MG/ML IV SOLN
INTRAVENOUS | Status: DC | PRN
  Administered 2011-10-17: 100 mg via INTRAVENOUS

## 2011-10-17 MED ORDER — ONDANSETRON HCL 4 MG/2ML IJ SOLN: 4.0000 mg | INTRAMUSCULAR | Status: DC | PRN

## 2011-10-17 MED ORDER — ACETAMINOPHEN 10 MG/ML IV SOLN
INTRAVENOUS | Status: AC
  Filled 2011-10-17: qty 100

## 2011-10-17 MED ORDER — HYDROMORPHONE HCL PF 1 MG/ML IJ SOLN
INTRAMUSCULAR | Status: DC | PRN
  Administered 2011-10-17: 1 mg via INTRAVENOUS

## 2011-10-17 SURGICAL SUPPLY — 83 items
ADH SKN CLS APL DERMABOND .7 (GAUZE/BANDAGES/DRESSINGS)
APL SKNCLS STERI-STRIP NONHPOA (GAUZE/BANDAGES/DRESSINGS)
APL SRG 32X5 SNPLK LF DISP (MISCELLANEOUS) ×2
APPLICATOR COTTON TIP 6IN STRL (MISCELLANEOUS) ×3 IMPLANT
APPLIER CLIP ROT 13.4 12 LRG (CLIP)
APR CLP LRG 13.4X12 ROT 20 MLT (CLIP)
BAG SPEC RTRVL LRG 6X4 10 (ENDOMECHANICALS) ×2
BENZOIN TINCTURE PRP APPL 2/3 (GAUZE/BANDAGES/DRESSINGS) IMPLANT
BLADE SURG 15 STRL LF DISP TIS (BLADE) IMPLANT
BLADE SURG 15 STRL SS (BLADE)
BLADE SURG SZ11 CARB STEEL (BLADE) ×3 IMPLANT
CABLE HIGH FREQUENCY MONO STRZ (ELECTRODE) ×6 IMPLANT
CANISTER SUCTION 2500CC (MISCELLANEOUS) ×3 IMPLANT
CHLORAPREP W/TINT 26ML (MISCELLANEOUS) ×3 IMPLANT
CLIP APPLIE ROT 13.4 12 LRG (CLIP) IMPLANT
CLIP SUT LAPRA TY ABSORB (SUTURE) ×6 IMPLANT
CLOTH BEACON ORANGE TIMEOUT ST (SAFETY) ×3 IMPLANT
COVER SURGICAL LIGHT HANDLE (MISCELLANEOUS) ×3 IMPLANT
CUTTER LINEAR ENDO ART 45 ETS (STAPLE) ×3 IMPLANT
DECANTER SPIKE VIAL GLASS SM (MISCELLANEOUS) IMPLANT
DERMABOND ADVANCED (GAUZE/BANDAGES/DRESSINGS)
DERMABOND ADVANCED .7 DNX12 (GAUZE/BANDAGES/DRESSINGS) IMPLANT
DEVICE SUTURE ENDOST 10MM (ENDOMECHANICALS) ×3 IMPLANT
DISSECTOR BLUNT TIP ENDO 5MM (MISCELLANEOUS) IMPLANT
DRAIN PENROSE 18X1/4 LTX STRL (WOUND CARE) ×3 IMPLANT
DRAPE CAMERA CLOSED 9X96 (DRAPES) ×3 IMPLANT
ELECT REM PT RETURN 9FT ADLT (ELECTROSURGICAL) ×3
ELECTRODE REM PT RTRN 9FT ADLT (ELECTROSURGICAL) ×2 IMPLANT
GAUZE SPONGE 4X4 16PLY XRAY LF (GAUZE/BANDAGES/DRESSINGS) ×3 IMPLANT
GLOVE BIOGEL PI IND STRL 7.0 (GLOVE) ×2 IMPLANT
GLOVE BIOGEL PI IND STRL 8.5 (GLOVE) ×2 IMPLANT
GLOVE BIOGEL PI INDICATOR 7.0 (GLOVE) ×1
GLOVE BIOGEL PI INDICATOR 8.5 (GLOVE) ×1
GLOVE SURG SS PI 8.0 STRL IVOR (GLOVE) ×3 IMPLANT
GOWN STRL NON-REIN LRG LVL3 (GOWN DISPOSABLE) ×3 IMPLANT
GOWN STRL REIN XL XLG (GOWN DISPOSABLE) ×12 IMPLANT
HEMOSTAT SURGICEL 4X8 (HEMOSTASIS) IMPLANT
HOVERMATT SINGLE USE (MISCELLANEOUS) ×3 IMPLANT
IV LACTATED RINGER IRRG 3000ML (IV SOLUTION) ×2
IV LR IRRIG 3000ML ARTHROMATIC (IV SOLUTION) ×2 IMPLANT
KIT BASIN OR (CUSTOM PROCEDURE TRAY) ×3 IMPLANT
KIT GASTRIC LAVAGE 34FR ADT (SET/KITS/TRAYS/PACK) ×3 IMPLANT
NEEDLE SPNL 22GX3.5 QUINCKE BK (NEEDLE) ×3 IMPLANT
NS IRRIG 1000ML POUR BTL (IV SOLUTION) ×6 IMPLANT
PACK CARDIOVASCULAR III (CUSTOM PROCEDURE TRAY) ×3 IMPLANT
PEN SKIN MARKING BROAD (MISCELLANEOUS) ×3 IMPLANT
POUCH SPECIMEN RETRIEVAL 10MM (ENDOMECHANICALS) ×3 IMPLANT
RELOAD 45 VASCULAR/THIN (ENDOMECHANICALS) ×6 IMPLANT
RELOAD BLUE (STAPLE) ×6 IMPLANT
RELOAD ENDO STITCH 2.0 (ENDOMECHANICALS) ×28
RELOAD GOLD (STAPLE) ×3 IMPLANT
RELOAD STAPLE TA45 3.5 REG BLU (ENDOMECHANICALS) ×6 IMPLANT
RELOAD WHITE ECR60W (STAPLE) ×6 IMPLANT
SCALPEL HARMONIC ACE (MISCELLANEOUS) ×3 IMPLANT
SCISSORS LAP 5X35 DISP (ENDOMECHANICALS) ×3 IMPLANT
SEALANT SURGICAL APPL DUAL CAN (MISCELLANEOUS) ×6 IMPLANT
SET IRRIG TUBING LAPAROSCOPIC (IRRIGATION / IRRIGATOR) ×3 IMPLANT
SLEEVE ADV FIXATION 12X100MM (TROCAR) ×6 IMPLANT
SLEEVE ADV FIXATION 5X100MM (TROCAR) ×3 IMPLANT
SOLUTION ANTI FOG 6CC (MISCELLANEOUS) ×3 IMPLANT
SPONGE GAUZE 4X4 12PLY (GAUZE/BANDAGES/DRESSINGS) IMPLANT
STAPLER STANDARD HANDLE (STAPLE) ×3 IMPLANT
STAPLER VISISTAT 35W (STAPLE) ×3 IMPLANT
STRIP CLOSURE SKIN 1/2X4 (GAUZE/BANDAGES/DRESSINGS) IMPLANT
SUT ETHILON 3 0 PS 1 (SUTURE) IMPLANT
SUT MNCRL AB 4-0 PS2 18 (SUTURE) ×3 IMPLANT
SUT RELOAD ENDO STITCH 2 48X1 (ENDOMECHANICALS) ×12
SUT RELOAD ENDO STITCH 2.0 (ENDOMECHANICALS) ×10
SUT VIC AB 2-0 SH 27 (SUTURE) ×2
SUT VIC AB 2-0 SH 27X BRD (SUTURE) ×2 IMPLANT
SUTURE RELOAD END STTCH 2 48X1 (ENDOMECHANICALS) ×12 IMPLANT
SUTURE RELOAD ENDO STITCH 2.0 (ENDOMECHANICALS) ×10 IMPLANT
SYR 20CC LL (SYRINGE) ×3 IMPLANT
SYR 50ML LL SCALE MARK (SYRINGE) ×3 IMPLANT
SYR CONTROL 10ML LL (SYRINGE) ×3 IMPLANT
TOWEL OR 17X26 10 PK STRL BLUE (TOWEL DISPOSABLE) ×6 IMPLANT
TRAY FOLEY CATH 14FRSI W/METER (CATHETERS) ×3 IMPLANT
TROCAR ADV FIXATION 12X100MM (TROCAR) ×3 IMPLANT
TROCAR XCEL 12X100 BLDLESS (ENDOMECHANICALS) ×3 IMPLANT
TROCAR Z-THREAD FIOS 5X100MM (TROCAR) ×3 IMPLANT
TUBING ENDO SMARTCAP (MISCELLANEOUS) ×3 IMPLANT
TUBING FILTER THERMOFLATOR (ELECTROSURGICAL) ×3 IMPLANT
WATER STERILE IRR 1500ML POUR (IV SOLUTION) ×3 IMPLANT

## 2011-10-17 NOTE — Anesthesia Preprocedure Evaluation (Addendum)
Anesthesia Evaluation  Patient identified by MRN, date of birth, ID band Patient awake    Reviewed: Allergy & Precautions, H&P , NPO status , Patient's Chart, lab work & pertinent test results, reviewed documented beta blocker date and time   Airway Mallampati: II TM Distance: >3 FB Neck ROM: full    Dental No notable dental hx.    Pulmonary shortness of breath and with exertion, asthma , sleep apnea and Continuous Positive Airway Pressure Ventilation ,  clear to auscultation  Pulmonary exam normal       Cardiovascular hypertension, On Medications and On Home Beta Blockers regular Normal myoview normal 10/12   Neuro/Psych Negative Neurological ROS  Negative Psych ROS   GI/Hepatic negative GI ROS, Neg liver ROS, Poorly Controlled,  Endo/Other  Negative Endocrine ROS  Renal/GU negative Renal ROS  Genitourinary negative   Musculoskeletal   Abdominal   Peds  Hematology negative hematology ROS (+)   Anesthesia Other Findings   Reproductive/Obstetrics negative OB ROS                           Anesthesia Physical Anesthesia Plan  ASA: III  Anesthesia Plan: General   Post-op Pain Management:    Induction: Intravenous  Airway Management Planned: Oral ETT  Additional Equipment:   Intra-op Plan:   Post-operative Plan: Extubation in OR  Informed Consent: I have reviewed the patients History and Physical, chart, labs and discussed the procedure including the risks, benefits and alternatives for the proposed anesthesia with the patient or authorized representative who has indicated his/her understanding and acceptance.   Dental Advisory Given  Plan Discussed with: CRNA and Surgeon  Anesthesia Plan Comments:         Anesthesia Quick Evaluation

## 2011-10-17 NOTE — Anesthesia Postprocedure Evaluation (Signed)
  Anesthesia Post-op Note  Patient: Madison Alvarado  Procedure(s) Performed: Procedure(s) (LRB): LAPAROSCOPIC ROUX-EN-Y GASTRIC (N/A) UPPER GI ENDOSCOPY ()  Patient Location: PACU  Anesthesia Type: General  Level of Consciousness: awake and alert   Airway and Oxygen Therapy: Patient Spontanous Breathing  Post-op Pain: mild  Post-op Assessment: Post-op Vital signs reviewed, Patient's Cardiovascular Status Stable, Respiratory Function Stable, Patent Airway and No signs of Nausea or vomiting  Post-op Vital Signs: stable  Complications: No apparent anesthesia complications

## 2011-10-17 NOTE — Op Note (Signed)
Preop diagnosis: Morbid obesity  Postop diagnosis: Morbid obesity  Body mass index is 41.64 kg/(m^2).  Surgical procedure: Laparoscopic Roux-en-Y gastric bypass  Surgeon: Sharlet Salina T.Oseas Detty M.D.  Asst.: Luretha Murphy M.D.  Anesthesia: General  Complications:  None  EBL: Minimal  Drains: None  Disposition: PACU in good condition  Description of procedure: Patient is brought to the operating room and general anesthesia induced. She had received preoperative broad-spectrum IV antibiotics and subcutaneous heparin. The abdomen was widely sterilely prepped and draped. Patient timeout was performed and correct patient and procedure confirmed. Access was obtained with a 12 mm Optiview trocar in the left upper quadrant and pneumoperitoneum established without difficulty. Under direct vision 12 mm trocars were placed laterally in the right upper quadrant, right upper quadrant midclavicular line, and to the left and above the umbilicus for the camera port. A 5 mm trocar was placed laterally in the left upper quadrant. The omentum was brought into the upper abdomen and the transverse mesocolon elevated and the ligament of Treitz clearly identified. A 40 cm biliopancreatic limb was then carefully measured from the ligament of Treitz. The small intestine was divided at this point with a single firing of the white load linear stapler. A Penrose drain was sutured to the end of the Roux-en-Y limb for later identification. A 100 cm Roux-en-Y limb was then carefully measured. At this point a side-to-side anastomosis was created between the Roux limb and the end of the biliopancreatic limb. This was accomplished with a single firing of the 45 mm white load linear stapler. The common enterotomy was closed with a running 2-0 Vicryl begun at either end of the enterotomy and tied centrally. The mesenteric defect was then closed with running 2-0 silk. The omentum was then divided with the harmonic scalpel up towards  the transverse colon to allow mobility of the Roux limb toward the gastric pouch. The patient was then placed in steep reversed Trendelenburg. Through a 5 mm subxiphoid site the [facility] retractor was placed and the left lobe of the liver elevated with excellent exposure of the upper stomach and hiatus. The angle of Hiss was then mobilized with the harmonic scalpel. A 4 cm gastric pouch was then carefully measured along the lesser curve of the stomach. Dissection was carried along the lesser curve at this point with the Harmonic scalpel working carefully back toward the lesser sac at right angles to the lesser curve. The free lesser sac was then entered. After being sure all tubes were removed from the stomach an initial firing of the gold load 60 mm linear stapler was fired at right angles across the lesser curve for about 4 cm. The gastric pouch was further mobilized posteriorly and then the pouch was completed with 2 further firings of the 60 mm blue load linear stapler up through the previously dissected angle of His. It was ensured that the pouch was completely mobilized away from the gastric remnant. This created a nice tubular 4-5 cm gastric pouch. The staple line of the gastric remnant was then oversewn with 2-0 silk for hemostasis. The Roux limb was then brought up in an antecolic fashion with the candycane facing to the patient's left without undue tension. The gastrojejunostomy was created with an initial posterior row of 2-0 Vicryl between the Roux limb and the staple line of the gastric pouch. Enterotomies were then made in the gastric pouch and the Roux limb with the harmonic scalpel and at approximately 2-2-1/2 cm anastomosis was created with a single  firing of the blue load linear stapler. The staple line was inspected and was intact without bleeding. The common enterotomy was then closed with running 2-0 Vicryl begun at either end and tied centrally. The wall tube was then easily passed through the  anastomosis and an outer anterior layer of running 2-0 Vicryl was placed. The Ewald tube was removed. With the outlet of the gastrojejunostomy clamped and under saline irrigation the assistant performed upper endoscopy and with the gastric pouch tensely distended with air there was no evidence of leak. The pouch was desufflated. The Vonita Moss defect was closed with running 2-0 silk. The abdomen was inspected for any evidence of bleeding or bowel injury and everything looked fine. The Nathanson retractor was removed under direct vision after coating the anastomosis with Tisseel tissue sealant. All CO2 was evacuated and trochars removed. Skin incisions were closed with staples. Sponge needle and instrument counts were correct. The patient was taken to the PACU in good condition.     Mariella Saa MD, FACS  10/17/2011, 4:52 PM

## 2011-10-17 NOTE — Op Note (Signed)
Surgeon: Wenda Low, MD, FACS  Asst:  none  Anes:  gen  Procedure: Upper endoscopy    Diagnosis: Roux Y Gastric Bypass in progress   Complications: None   EBL:   0 cc  Description of Procedure:  After the gastrojejuostomy was performed, I passed the adult endoscope easily into the esophagus.  The stomach pouch was entered and insufflation was performed to check for leaks and no bubbles were seen.  No evidence of active bleeding. The pouch was 4-5 cm in length and had a wide open anastomosis.  The scope was withdrawn without difficulty.     Matt B. Daphine Deutscher, MD, [facility], [place] [phone]

## 2011-10-17 NOTE — H&P (View-Only) (Signed)
Subjective:   For Roux-en-Y gastric bypass  Patient ID: Madison Alvarado, female   DOB: [birth year], 51 y.o.   MRN: [id number]  HPI Patient returns for a preop visit prior to planned laparoscopic Roux-en-Y gastric bypass. She has successfully completed her psych and nutrition evaluations. We reviewed her preoperative workup. Ultrasound showed some hepatic steatosis but was otherwise negative. She has mildly elevated transaminases. Mildly elevated lipids. The remainder of her lab work was unremarkable. We reviewed the procedure again today. We went through the consent form completely and all her questions were answered. Past Medical History  Diagnosis Date  . Asthma   . Hypertension   . Sleep apnea   . Hyperlipidemia   . Hypertriglyceridemia   . Elevated LFTs   . Elevated glucose   . Low blood potassium    Past Surgical History  Procedure Date  . Breath tek h pylori 07/13/2011    Procedure: BREATH TEK H PYLORI;  Surgeon: Mariella Saa, MD;  Location: [facility];  Service: General;  Laterality: N/A;   Current Outpatient Prescriptions  Medication Sig Dispense Refill  . amLODipine (NORVASC) 5 MG tablet Take 5 mg by mouth every morning.       Marland Kitchen atenolol-chlorthalidone (TENORETIC) 100-25 MG per tablet Take 1 tablet by mouth daily before lunch.      Marland Kitchen NIASPAN 500 MG CR tablet Take 500 mg by mouth at bedtime.       . potassium chloride SA (K-DUR,KLOR-CON) 20 MEQ tablet Take 20 mEq by mouth daily.        . pravastatin (PRAVACHOL) 40 MG tablet Take 40 mg by mouth at bedtime.       Marland Kitchen albuterol (PROVENTIL HFA;VENTOLIN HFA) 108 (90 BASE) MCG/ACT inhaler Inhale 2 puffs into the lungs every 6 (six) hours as needed. For shortness of breath      . aspirin 81 MG tablet Take 81 mg by mouth every morning.       . beclomethasone (QVAR) 40 MCG/ACT inhaler Inhale 2 puffs into the lungs 2 (two) times daily.       . Biotin 3 MG TABS Take 3 mg by mouth daily.      . calcium citrate-vitamin D (CITRACAL+D)  315-200 MG-UNIT per tablet Take 1 tablet by mouth daily.      . cholecalciferol (VITAMIN D) 1000 UNITS tablet Take 1,000 Units by mouth daily.      Marland Kitchen ESZOPICLONE 3 MG tablet Take 3 mg by mouth at bedtime as needed. SLEEP      . fluticasone (FLONASE) 50 MCG/ACT nasal spray Place 2 sprays into the nose daily as needed. For allergies      . ibuprofen (ADVIL,MOTRIN) 600 MG tablet Ad lib.      Marland Kitchen loratadine (CLARITIN) 10 MG tablet Take 10 mg by mouth daily as needed. For allergies      . Multiple Vitamin (MULITIVITAMIN WITH MINERALS) TABS Take 1 tablet by mouth daily.      . Omega-3 Fatty Acids (FISH OIL) 1000 MG CAPS Take 1,000 mg by mouth 3 (three) times daily.      . vitamin E 200 UNIT capsule Take 200 Units by mouth daily.         No Known Allergies History  Substance Use Topics  . Smoking status: Former Smoker    Quit date: 06/16/2011  . Smokeless tobacco: Never Used  . Alcohol Use: No     Review of Systems  Constitutional: Negative for fever, chills and fatigue.  Respiratory: Negative.  Cardiovascular: Negative.   Gastrointestinal: Negative.        Objective:   Physical Exam General: Alert,Morbidly obese African American female in no distress Skin: Warm and dry without rash or infection. HEENT: No palpable masses or thyromegaly. Sclera nonicteric. Pupils equal round and reactive. Oropharynx clear. Lymph nodes: No cervical, supraclavicular, or inguinal nodes palpable. Lungs: Breath sounds clear and equal without increased work of breathing Cardiovascular: Regular rate and rhythm without murmur. No JVD or edema. Peripheral pulses intact. Abdomen: Nondistended. Soft and nontender. No masses palpable. No organomegaly. No palpable hernias. Extremities: No edema or joint swelling or deformity. No chronic venous stasis changes. Neurologic: Alert and fully oriented. Gait normal.     Assessment:     51 year old female with progressive morbid obesity unresponsive to medical  management and multiple comorbidities including obstructive sleep apnea requiring CPAP, hypertension, dyslipidemia, borderline diabetes diet controlled, and asthma. Her preoperative workup is complete and she understands the procedure and all her questions have been answered.    Plan:      For laparoscopic Roux-en-Y gastric bypass

## 2011-10-17 NOTE — Interval H&P Note (Signed)
History and Physical Interval Note:  10/17/2011 1:19 PM  Madison Alvarado  has presented today for surgery, with the diagnosis of morbid obesity   The various methods of treatment have been discussed with the patient and family. After consideration of risks, benefits and other options for treatment, the patient has consented to  Procedure(s): LAPAROSCOPIC ROUX-EN-Y GASTRIC as a surgical intervention .  The patients' history has been reviewed, patient examined, no change in status, stable for surgery.  I have reviewed the patients' chart and labs.  Questions were answered to the patient's satisfaction.     Karissa Meenan T

## 2011-10-17 NOTE — Preoperative (Signed)
Beta Blockers   Reason not to administer Beta Blockers:Pt took Atenolol 100 mg at 0600 10-17-11

## 2011-10-17 NOTE — Transfer of Care (Signed)
Immediate Anesthesia Transfer of Care Note  Patient: [name]  Procedure(s) Performed: Procedure(s) (LRB): LAPAROSCOPIC ROUX-EN-Y GASTRIC (N/A) UPPER GI ENDOSCOPY ()  Patient Location: PACU  Anesthesia Type: General  Level of Consciousness: sedated, patient cooperative and responds to stimulaton  Airway & Oxygen Therapy: Patient Spontanous Breathing and Patient connected to face mask oxgen  Post-op Assessment: Report given to PACU RN and Post -op Vital signs reviewed and stable  Post vital signs: Reviewed and stable  Complications: No apparent anesthesia complications

## 2011-10-18 ENCOUNTER — Inpatient Hospital Stay (HOSPITAL_COMMUNITY): Payer: BC Managed Care – PPO

## 2011-10-18 DIAGNOSIS — Z09 Encounter for follow-up examination after completed treatment for conditions other than malignant neoplasm: Secondary | ICD-10-CM

## 2011-10-18 LAB — CBC
MCH: 29.7 pg (ref 26.0–34.0)
MCHC: 34.1 g/dL (ref 30.0–36.0)
MCV: 86.9 fL (ref 78.0–100.0)
Platelets: 285 10*3/uL (ref 150–400)
RDW: 12.6 % (ref 11.5–15.5)

## 2011-10-18 LAB — DIFFERENTIAL
Basophils Absolute: 0 10*3/uL (ref 0.0–0.1)
Basophils Relative: 0 % (ref 0–1)
Eosinophils Absolute: 0 10*3/uL (ref 0.0–0.7)
Eosinophils Relative: 0 % (ref 0–5)
Neutrophils Relative %: 78 % — ABNORMAL HIGH (ref 43–77)

## 2011-10-18 LAB — HEMOGLOBIN AND HEMATOCRIT, BLOOD: Hemoglobin: 12.2 g/dL (ref 12.0–15.0)

## 2011-10-18 MED ORDER — IOHEXOL 300 MG/ML  SOLN: 50.0000 mL | Freq: Once | INTRAMUSCULAR | Status: AC | PRN

## 2011-10-18 NOTE — Progress Notes (Addendum)
VASCULAR LAB PRELIMINARY  PRELIMINARY  PRELIMINARY  PRELIMINARY  Bilateral lower extremity venous duplex completed at 0915.    Preliminary report:  Bilateral:  No evidence of DVT, superficial thrombosis, or Baker's Cyst.    Terance Hart, RVT 10/18/2011, 10:43 AM

## 2011-10-18 NOTE — Progress Notes (Signed)
Notified Dr. Johna Sheriff of UGI results; orders received to advance pt to POD #1 diet; Colin Mulders, RN notified.  Talmadge Chad, RN Bariatric Nurse Coordinator

## 2011-10-18 NOTE — Progress Notes (Signed)
Patient ID: Madison Alvarado, female   DOB: 1961-01-06, 51 y.o.   MRN: [id number] 1 Day Post-Op  Subjective: No complaints this morning. Denies pain or nausea. She has been up and walking.  Objective: Vital signs in last 24 hours: Temp:  [97.8 F (36.6 C)-98.5 F (36.9 C)] 98 F (36.7 C) (02/12 6578) Pulse Rate:  [66-82] 66  (02/12 0632) Resp:  [16-22] 16  (02/12 4696) BP: (110-158)/(59-75) 117/67 mmHg (02/12 0632) SpO2:  [95 %-100 %] 95 % (02/12 2952) Weight:  [258 lb (117.028 kg)-263 lb (119.296 kg)] 263 lb (119.296 kg) (02/11 1830) Last BM Date: 10/16/11  Intake/Output from previous day: 02/11 0701 - 02/12 0700 In: 3696.3 [I.V.:3696.3] Out: 1075 [Urine:975; Blood:100] Intake/Output this shift:    General appearance: alert and no distress Resp: clear to auscultation bilaterally GI: normal findings: soft, non-tender Incision/Wound:clean and dry  Lab Results:   Basename 10/18/11 0332 10/17/11 2025  WBC 8.4 --  HGB 12.9 13.0  HCT 37.8 37.7  PLT 285 --   BMET No results found for this basename: NA:2,K:2,CL:2,CO2:2,GLUCOSE:2,BUN:2,CREATININE:2,CALCIUM:2 in the last 72 hours PT/INR No results found for this basename: LABPROT:2,INR:2 in the last 72 hours ABG No results found for this basename: PHART:2,PCO2:2,PO2:2,HCO3:2 in the last 72 hours  Studies/Results: No results found.  Anti-infectives: Anti-infectives     Start     Dose/Rate Route Frequency Ordered Stop   10/17/11 0930   cefOXitin (MEFOXIN) 2 g in dextrose 5 % 50 mL IVPB        2 g 100 mL/hr over 30 Minutes Intravenous 60 min pre-op 10/17/11 0918 10/17/11 1337          Assessment/Plan: s/p Procedure(s): LAPAROSCOPIC ROUX-EN-Y GASTRIC UPPER GI ENDOSCOPY Doing very well postoperatively. Await Gastrografin swallow this morning.   LOS: 1 day    Madison Alvarado T 10/18/2011

## 2011-10-18 NOTE — Progress Notes (Signed)
Pt alert and oriented; VSS; having Doppler study done; denies any nausea or vomiting; negative flatus or BM; c/o mild abdominal pain with relief from prn pain meds; voiding well; ambulating without difficulty; awaiting UGI; discharge instructions given for pt to review and will complete tomorrow; continue plan of care. GASTRIC BYPASS/SLEEVE DISCHARGE INSTRUCTIONS  Drs. Fredrik Rigger, Hoxworth, Wilson, and [place] Call if you have any problems.   Call [phone] and ask for the surgeon on call.    If you need immediate assistance come to the ER at [facility]. Tell the ER personnel that you are a new post-op gastric bypass patient. Signs and symptoms to report:   Severe vomiting or nausea. If you cannot tolerate clear liquids for longer than 1 day, you need to call your surgeon.    Abdominal pain which does not get better after taking your pain medication   Fever greater than 101 F degree   Difficulty breathing   Chest pain    Redness, swelling, drainage, or foul odor at incision sites    If your incisions open or pull apart   Swelling or pain in calf (lower leg)   Diarrhea, frequent watery, uncontrolled bowel movements.   Constipation, (no bowel movements for 3 days) if this occurs, Take Milk of Magnesia, 2 tablespoons by mouth, 3 times a day for 2 days if needed.  Call your doctor if constipation continues. Stop taking Milk of Magnesia once you have had a bowel movement. You may also use Miralax according to the label instructions.   Anything you consider "abnormal for you".   Normal side effects after Surgery:   Unable to sleep at night or concentrate   Irritability   Being tearful (crying) or depressed   These are common complaints, possibly related to your anesthesia, stress of surgery and change in lifestyle, that usually go away a few weeks after surgery.  If these feelings continue, call your medical doctor.  Wound Care You may have surgical glue, steri-strips, or staples over  your incisions after surgery.  Surgical glue:  Looks like a clear film over your incisions and will wear off gradually. Steri-strips: Strips of tape over your incisions. You may notice a yellowish color on the skin underneath the steri-strips. This is a substance used to make the steri-strips stick better. Do not pull the steri-strips off - let them fall off.  Staples: Cherlynn Polo may be removed before you leave the hospital. If you go home with staples, call [facility] [phone]) for an appointment with your surgeon's nurse to have staples removed in 7 - 10 days. Showering: You may shower two days after your surgery unless otherwise instructed by your surgeon. Wash gently around wounds with warm soapy water, rinse well, and gently pat dry.  If you have a drain, you may need someone to hold this while you shower. Avoid tub baths until staples are removed and incisions are healed.    Medications   Medications should be liquid or crushed if larger than the size of a dime.  Extended release pills should not be crushed.   Depending on the size and number of medications you take, you may need to stagger/change the time you take your medications so that you do not over-fill your pouch.    Make sure you follow-up with your primary care physician to make medication adjustments needed during rapid weight loss and life-style adjustment.   If you are diabetic, follow up with the doctor that prescribes your diabetes  medication(s) within one week after surgery and check your blood sugar regularly.   Do not drive while taking narcotics!   Do not take acetaminophen (Tylenol) and Roxicet or Lortab Elixir at the same time since these pain medications contain acetaminophen.  Diet at home: (First 2 Weeks) You will see the nutritionist two weeks after your surgery. She will advance your diet if you are tolerating liquids well. Once at home, if you have severe vomiting or nausea and cannot tolerate clear  liquids lasting longer than 1 day, call your surgeon.  Begin high protein shake 2 ounces every 3 hours, 5 - 6 times per day.  Gradually increase the amount you drink as tolerated.  You may find it easier to slowly sip shakes throughout the day.  It is important to get your proteins in first.   Protein Shake   Drink at least 2 ounces of shake 5-6 times per day   Each serving of protein shakes should have a minimum of 15 grams of protein and no more than 5 grams of carbohydrate    Increase the amount of protein shake you drink as tolerated   Protein powder may be added to fluids such as non-fat milk or Lactaid milk (limit to 20 grams added protein powder per serving   The initial goal is to drink at least 8 ounces of protein shake/drink per day (or as directed by the nutritionist). Some examples of protein shakes are [employer], [employer], EAS Edge HP, and Unjury. Hydration   Gradually increase the amount of water and other liquids as tolerated (See Acceptable Fluids)   Gradually increase the amount of protein shake as tolerated     Sip fluids slowly and throughout the day   May use Sugar substitutes, use sparingly (limit to 6 - 8 packets per day). Your fluid goal is 64 ounces of fluid daily. It may take a few weeks to build up to this.         32 oz (or more) should be clear liquids and 32 oz (or more) should be full liquids.         Liquids should not contain sugar, caffeine, or carbonation! Acceptable Fluids Clear Liquids:   Water or Sugar-free flavored water, Fruit H2O   Decaffeinated coffee or tea (sugar-free)   Crystal Lite, Wyler's Lite, Minute Maid Lite   Sugar-free Jell-O   Bouillon or broth   Sugar-free Popsicle:   *Less than 20 calories each; Limit 1 per day   Full Liquids:              Protein Shakes/Drinks + 2 choices per day of other full liquids shown below.    Other full liquids must be: No more than 12 grams of Carbs per serving,  No more than 3 grams of Fat per  serving   Strained low-fat cream soup   Non-Fat milk   Fat-free Lactaid Milk   Sugar-free yogurt (Dannon Lite & Fit) Vitamins and Minerals (Start 1 day after surgery unless otherwise directed)   2 Chewable Multivitamin / Multimineral Supplement (i.e. Centrum for Adults)   Chewable Calcium Citrate with Vitamin D-3. Take 1500 mg each day.           (Example: 3 Chewable Calcium Plus 600 with Vitamin D-3 can be found at [facility])         Vitamin B-12, 350 - 500 micrograms (oral tablet) each day   Do not mix multivitamins containing iron with calcium supplements; take 2  hours   apart   Do not substitute Tums (calcium carbonate) for your calcium   Menstruating women and those at risk for anemia may need extra iron. Talk with your doctor to see if you need additional iron.    If you need extra iron:  Total daily Iron recommendations (including Vitamins) = 50 - 100 mg Iron/day Do not stop taking or change any vitamins or minerals until you talk to your nutritionist or surgeon. Your nutritionist and / or physician must approve all vitamin and mineral supplements. Exercise For maximum success, begin exercising as soon as your doctor recommends. Make sure your physician approves any physical activity.   Depending on fitness level, begin with a simple walking program   Walk 5-15 minutes each day, 7 days per week.    Slowly increase until you are walking 30-45 minutes per day   Consider joining our BELT program. [phone] or email [email or web address] [email or web address] Things to remember:    You may have sexual relations when you feel comfortable. It is VERY important for female patients to use a reliable birth control method. Fertility often increases after surgery. Do not get pregnant for at least 18 months.   It is very important to keep all follow up appointments with your surgeon, nutritionist, primary care physician, and behavioral health practitioner. After the first year, please follow up with your bariatric surgeon at  least once a year in order to maintain best weight loss results.  [facility]: [phone] [facility]: [phone]   Free counseling is available for you and your family through collaboration between [facility] and [place]. Please call [phone] and leave a message.    Consider purchasing a medical alert bracelet that says you had gastric bypass surgery.    [facility] has a free Bariatric Surgery Support Group that meets monthly, the 3rd Thursday, 6 pm, Classroom #1, [place]. You may register online at [email or web address], but registration is not necessary. Select Classes and Support Groups, Bariatric Surgery, or Call [phone]   Do not return to work or drive until cleared by your surgeon   Use your CPAP when sleeping if applicable   Do not lift anything greater than ten pounds for at least two weeks  Talmadge Chad, RN Bariatric Nurse coordinator

## 2011-10-19 LAB — CBC
Hemoglobin: 12.2 g/dL (ref 12.0–15.0)
MCH: 29.8 pg (ref 26.0–34.0)
MCHC: 33.6 g/dL (ref 30.0–36.0)
RDW: 12.9 % (ref 11.5–15.5)

## 2011-10-19 LAB — DIFFERENTIAL
Basophils Absolute: 0.1 10*3/uL (ref 0.0–0.1)
Basophils Relative: 1 % (ref 0–1)
Eosinophils Absolute: 0.1 10*3/uL (ref 0.0–0.7)
Monocytes Absolute: 0.5 10*3/uL (ref 0.1–1.0)
Neutrophils Relative %: 47 % (ref 43–77)

## 2011-10-19 MED ORDER — OXYCODONE-ACETAMINOPHEN 5-325 MG/5ML PO SOLN: 5.0000 mL | ORAL | Status: AC | PRN

## 2011-10-19 NOTE — Progress Notes (Addendum)
Pt alert and oriented; sitting up in chair; VSS; sipping on protein shake; tolerating water well;denies any nausea or vomiting;  +flatus; no BM at this point; voiding without difficulty; ambulating in hallways well; c/o mild abdominal pain with relief from prn pain meds; pt already has follow up appts with [facility] and [facility]; discharge instructions reviewed and pt verbalized understanding of. Talmadge Chad, RN Bariatric Nurse Coordinator

## 2011-10-19 NOTE — Progress Notes (Signed)
Patient is using her own cpap unit. Plugged into the red outlet for safety

## 2011-10-19 NOTE — Discharge Summary (Signed)
Patient ID: Madison Alvarado [id number] 51 y.o. August 21, 1961  10/17/2011  Discharge date and time: 10/19/2011   Admitting Physician: Glenna Fellows T  Discharge Physician: Glenna Fellows T  Admission Diagnoses: morbid obesity   Discharge Diagnoses: same  Operations: Procedure(s): LAPAROSCOPIC ROUX-EN-Y GASTRIC UPPER GI ENDOSCOPY  Admission Condition: good  Discharged Condition: good  Indication for Admission: the patient is a 51 year old female with morbid obesity unresponsive to medical management and multiple comorbidities as listed in her admission history and physical was electively admitted for laparoscopic Roux-en-Y gastric bypass.  Hospital Course: on the morning of admission the patient underwent an uneventful laparoscopic Roux-en-Y gastric bypass. Her postoperative course was very smooth. Her Gastrografin swallow on the first postoperative day showed no leak or obstruction and she was begun on ice chips and water. Her CBC was normal and vital signs remained normal. She was started on protein shakes the second postoperative day and tolerated these and was ready for discharge. She had no complications.  Consults: None  Significant Diagnostic Studies: as above    Disposition: Home  Patient Instructions:   [facility], [place]  Home Medication Instructions WUJ:[id number]   Printed on:10/19/11 [phone]  Medication Information                    amLODipine (NORVASC) 5 MG tablet Take 5 mg by mouth every morning.            pravastatin (PRAVACHOL) 40 MG tablet Take 40 mg by mouth at bedtime.            ESZOPICLONE 3 MG tablet Take 3 mg by mouth at bedtime as needed. SLEEP           NIASPAN 500 MG CR tablet Take 500 mg by mouth at bedtime.            beclomethasone (QVAR) 40 MCG/ACT inhaler Inhale 2 puffs into the lungs 2 (two) times daily.            vitamin E 200 UNIT capsule Take 200 Units by mouth daily.            potassium chloride SA (K-DUR,KLOR-CON) 20 MEQ  tablet Take 20 mEq by mouth daily.            atenolol-chlorthalidone (TENORETIC) 100-25 MG per tablet Take 1 tablet by mouth daily.            albuterol (PROVENTIL HFA;VENTOLIN HFA) 108 (90 BASE) MCG/ACT inhaler Inhale 2 puffs into the lungs every 6 (six) hours as needed. For shortness of breath           loratadine (CLARITIN) 10 MG tablet Take 10 mg by mouth daily as needed. For allergies           fluticasone (FLONASE) 50 MCG/ACT nasal spray Place 2 sprays into the nose daily as needed. For allergies           Multiple Vitamin (MULITIVITAMIN WITH MINERALS) TABS Take 1 tablet by mouth daily.           calcium citrate-vitamin D (CITRACAL+D) 315-200 MG-UNIT per tablet Take 1 tablet by mouth daily.           cholecalciferol (VITAMIN D) 1000 UNITS tablet Take 1,000 Units by mouth daily.           Omega-3 Fatty Acids (FISH OIL) 1000 MG CAPS Take 1,000 mg by mouth 3 (three) times daily.           Biotin 3 MG TABS Take 3 mg by mouth  daily.           oxyCODONE-acetaminophen (ROXICET) 5-325 MG/5ML solution Take 5-10 mLs by mouth every 4 (four) hours as needed.             Activity: no heavy lifting for 4 weeks Diet: per bariatric instructions Wound Care: none needed  Follow-up:  With Dr. Johna Sheriff in 1 week.  Signed: Mariella Saa MD, FACS  10/19/2011, 8:38 AM

## 2011-10-19 NOTE — Progress Notes (Signed)
Patient given discharge instructions and prescription. Verbalized understanding of instructions. Tolerated bariatric shake, pain level controlled with by mouth medication. Ambulating independently. Left unit in wheelchair accompanied by staff and husband

## 2011-10-19 NOTE — Discharge Instructions (Signed)
Per bariatric nurse °

## 2011-10-19 NOTE — Progress Notes (Signed)
Patient ID: Phebe Dettmer, female   DOB: Nov 24, 1960, 51 y.o.   MRN: [id number] 2 Days Post-Op  Subjective: Feels well without complaints. Has had a small loose bowel movement. Tolerating water well without nausea or pain.  Objective: Vital signs in last 24 hours: Temp:  [97.6 F (36.4 C)-98 F (36.7 C)] 98 F (36.7 C) (02/13 0600) Pulse Rate:  [59-66] 66  (02/13 0600) Resp:  [18] 18  (02/13 0600) BP: (109-148)/(59-85) 120/72 mmHg (02/13 0600) SpO2:  [95 %-97 %] 95 % (02/13 0600) Last BM Date: 10/16/11  Intake/Output from previous day: 02/12 0701 - 02/13 0700 In: 1400 [P.O.:200; I.V.:1200] Out: 1300 [Urine:1300] Intake/Output this shift:    General appearance: alert and no distress GI: normal findings: soft, non-tender  Lab Results:   Basename 10/19/11 0433 10/18/11 1820 10/18/11 0332  WBC 9.1 -- 8.4  HGB 12.2 12.2 --  HCT 36.3 36.0 --  PLT 275 -- 285   BMET No results found for this basename: NA:2,K:2,CL:2,CO2:2,GLUCOSE:2,BUN:2,CREATININE:2,CALCIUM:2 in the last 72 hours PT/INR No results found for this basename: LABPROT:2,INR:2 in the last 72 hours ABG No results found for this basename: PHART:2,PCO2:2,PO2:2,HCO3:2 in the last 72 hours  Studies/Results: Dg Ugi W/water Sol Cm  10/18/2011  *RADIOLOGY REPORT*  Clinical Data:  Postop gastric bypass 08/15/2012  UPPER GI SERIES WITH KUB  Technique:  Routine upper GI series was performed with water soluble  Fluoroscopy Time: 1.1 minutes  Comparison:  None.  Findings: Initial KUB shows no evidence obstruction.  45 ml water-soluble contrast was administered.  Contrast flowed freely through the gastroesophageal junction into a small gastric pouch.  Contrast flowed through the gastrojejunostomy without evidence of leak or obstruction.  Contrast flows into the proximal small bowel.  IMPRESSION: No evidence of leak or obstruction at the gastrojejunostomy.  Original Report Authenticated By: Genevive Bi, M.D.     Anti-infectives: Anti-infectives     Start     Dose/Rate Route Frequency Ordered Stop   10/17/11 0930   cefOXitin (MEFOXIN) 2 g in dextrose 5 % 50 mL IVPB        2 g 100 mL/hr over 30 Minutes Intravenous 60 min pre-op 10/17/11 0918 10/17/11 1337          Assessment/Plan: s/p Procedure(s): LAPAROSCOPIC ROUX-EN-Y GASTRIC UPPER GI ENDOSCOPY Doing very well postoperatively without evidence of complication. We'll begin protein shakes by mouth and plan discharge later today if tolerated well.   LOS: 2 days    Oiva Dibari T 10/19/2011

## 2011-10-27 ENCOUNTER — Encounter: Payer: Self-pay | Admitting: Radiology

## 2011-10-28 ENCOUNTER — Encounter (INDEPENDENT_AMBULATORY_CARE_PROVIDER_SITE_OTHER): Payer: Self-pay | Admitting: General Surgery

## 2011-10-28 ENCOUNTER — Ambulatory Visit (INDEPENDENT_AMBULATORY_CARE_PROVIDER_SITE_OTHER): Payer: BC Managed Care – PPO | Admitting: General Surgery

## 2011-10-28 NOTE — Progress Notes (Signed)
Patient returns for her first postop visit 10 days following laparoscopic Roux-en-Y gastric bypass. She is getting along extremely well. She is tolerating her protein shakes and feels ready to start solid food. No abnormal pain or nausea or other complaints. She is walking 20 or 30 minutes every day.  Exam: BP 132/86  Pulse 68  Temp(Src) 97.6 F (36.4 C) (Temporal)  Resp 16  Ht 5\' 6"  (1.676 m)  Wt 247 lb (112.038 kg)  BMI 39.87 kg/m2  LMP 05/13/2011  She appears well. Abdomen is soft and nontender and wounds healing nicely.  Assessment plan: Doing well following laparoscopic Roux-en-Y gastric bypass. No complications identified. Return in 2 weeks.

## 2011-11-01 ENCOUNTER — Encounter: Payer: Self-pay | Admitting: *Deleted

## 2011-11-01 ENCOUNTER — Encounter: Payer: Self-pay | Admitting: Emergency Medicine

## 2011-11-01 ENCOUNTER — Ambulatory Visit (INDEPENDENT_AMBULATORY_CARE_PROVIDER_SITE_OTHER): Payer: BC Managed Care – PPO | Admitting: Emergency Medicine

## 2011-11-01 ENCOUNTER — Encounter: Payer: BC Managed Care – PPO | Attending: Emergency Medicine | Admitting: *Deleted

## 2011-11-01 VITALS — BP 109/78 | HR 79 | Temp 97.2°F | Resp 16 | Ht 66.0 in | Wt 242.0 lb

## 2011-11-01 DIAGNOSIS — I10 Essential (primary) hypertension: Secondary | ICD-10-CM

## 2011-11-01 DIAGNOSIS — E785 Hyperlipidemia, unspecified: Secondary | ICD-10-CM

## 2011-11-01 DIAGNOSIS — E669 Obesity, unspecified: Secondary | ICD-10-CM | POA: Insufficient documentation

## 2011-11-01 DIAGNOSIS — Z79899 Other long term (current) drug therapy: Secondary | ICD-10-CM

## 2011-11-01 DIAGNOSIS — E789 Disorder of lipoprotein metabolism, unspecified: Secondary | ICD-10-CM

## 2011-11-01 DIAGNOSIS — Z713 Dietary counseling and surveillance: Secondary | ICD-10-CM | POA: Insufficient documentation

## 2011-11-01 LAB — COMPREHENSIVE METABOLIC PANEL
ALT: 40 U/L — ABNORMAL HIGH (ref 0–35)
AST: 35 U/L (ref 0–37)
Alkaline Phosphatase: 92 U/L (ref 39–117)
Calcium: 10.3 mg/dL (ref 8.4–10.5)
Chloride: 103 mEq/L (ref 96–112)
Creat: 0.86 mg/dL (ref 0.50–1.10)
Potassium: 3.8 mEq/L (ref 3.5–5.3)

## 2011-11-01 LAB — CBC WITH DIFFERENTIAL/PLATELET
Basophils Relative: 2 % — ABNORMAL HIGH (ref 0–1)
Eosinophils Absolute: 0.2 10*3/uL (ref 0.0–0.7)
HCT: 41.7 % (ref 36.0–46.0)
Hemoglobin: 14.1 g/dL (ref 12.0–15.0)
MCH: 29.4 pg (ref 26.0–34.0)
MCHC: 33.8 g/dL (ref 30.0–36.0)
Monocytes Absolute: 0.3 10*3/uL (ref 0.1–1.0)
Monocytes Relative: 7 % (ref 3–12)

## 2011-11-01 LAB — LIPID PANEL
LDL Cholesterol: 54 mg/dL (ref 0–99)
Triglycerides: 88 mg/dL (ref <150)
VLDL: 18 mg/dL (ref 0–40)

## 2011-11-01 NOTE — Progress Notes (Signed)
  Subjective:    Patient ID: Madison Alvarado, female    DOB: 1961/03/14, 51 y.o.   MRN: [id number]  HPI patient enters for recheck of her high blood pressure and high cholesterol. Approximately 2 weeks ago patient underwent the gastric bypass surgery. She's done incredibly well since his surgery. He is still on liquid diet only. She is walking but not able to do any other exercise.    Review of Systems she has been taking her blood pressure medicines and cholesterol medicine. She's not had any abdominal pain nausea or vomiting. She otherwise feels well.     Objective:   Physical Exam is exam reveals an alert cooperative female who is not in any distress. Her neck is supple chest clear heart regular rate no murmurs abdomen is soft well-healed portal sites. There is no tenderness.        Assessment & Plan:  Assessment is status post gastric bypass surgery in a patient with hypertension and high cholesterol.I hope. with weight loss that she may be able to come off some of her medications. She is very motivated and proceeding with her exercise program.

## 2011-11-01 NOTE — Progress Notes (Signed)
  Bariatric Class:  Appt start time: 1600 end time:  1700.  2 Week Post-Operative Nutrition Class  Patient was seen on 11/01/11 for Post-Operative Nutrition education at [facility].   Surgery date: 10/17/11 Surgery type: Gastric Bypass  Initial Weight: 264.4 lbs Weight today: 240.6 lbs Weight change: 22.4 lbs Total weight lost: 23.9 lbs BMI: 37.7%  The following the learning objective met the patient during this course:   Identifies Phase 3A (Soft, High Proteins) Dietary Goals and will begin from 2 weeks post-operatively to 2 months post-operatively   Identifies appropriate sources of fluids and proteins   States protein recommendations and appropriate sources post-operatively  Identifies the need for appropriate texture modifications, mastication, and bite sizes when consuming solids  Identifies appropriate multivitamin and calcium sources post-operatively  Describes the need for physical activity post-operatively and will follow MD recommendations  States when to call healthcare provider regarding medication questions or post-operative complications  Handouts given during class include:  Phase 3A: Soft, High Protein Diet Handout  Follow-Up Plan: Patient will follow-up at [facility] in 6 weeks for 2 months post-op nutrition visit for diet advancement per MD.

## 2011-11-01 NOTE — Patient Instructions (Addendum)
Goals:  Follow Phase 3A: Soft High Protein Phase  Eat 3-6 small meals/snacks, every 3-5 hrs  Increase lean protein foods to meet 60-80g goal  Increase fluid intake to 64oz +  Avoid drinking 15 minutes before, during and 30 minutes after eating  Aim for >30 min of physical activity daily per MD 

## 2011-11-01 NOTE — Progress Notes (Signed)
  Subjective:    Patient ID: Madison Alvarado, female    DOB: June 08, 1961, 51 y.o.   MRN: [id number]  HPI    Review of Systems     Objective:   Physical Exam        Assessment & Plan:

## 2011-11-02 ENCOUNTER — Encounter: Payer: Self-pay | Admitting: *Deleted

## 2011-11-04 ENCOUNTER — Ambulatory Visit (INDEPENDENT_AMBULATORY_CARE_PROVIDER_SITE_OTHER): Payer: BC Managed Care – PPO | Admitting: General Surgery

## 2011-11-07 ENCOUNTER — Encounter (HOSPITAL_COMMUNITY): Payer: Self-pay | Admitting: General Surgery

## 2011-11-18 ENCOUNTER — Encounter (INDEPENDENT_AMBULATORY_CARE_PROVIDER_SITE_OTHER): Payer: Self-pay | Admitting: General Surgery

## 2011-11-18 ENCOUNTER — Ambulatory Visit (INDEPENDENT_AMBULATORY_CARE_PROVIDER_SITE_OTHER): Payer: BC Managed Care – PPO | Admitting: General Surgery

## 2011-11-18 NOTE — Progress Notes (Signed)
History: Patient returns now one month following Roux-en-Y gastric bypass. She continues to do very well. She is tolerating her high-protein diet without any significant difficulty. She does have some occasional constipation which has responded to MiraLax. No dominant pain or vomiting. Energy level is good.  She is anticipating a move to [place] in a couple of weeks. She has not been able to find a bariatric surgical and to follow her. I told her she should at least find a PCP for regular visits and lab work and she is certainly welcome to come back here for visit her contact me by phone or e-mail for advice.  Exam: BP 119/78  Pulse 70  Temp(Src) 97.6 F (36.4 C) (Temporal)  Ht 5\' 7"  (1.702 m)  Wt 239 lb 6.4 oz (108.591 kg)  BMI 37.50 kg/m2  SpO2 99% Total weight loss 24 pounds General: Appears well Abdomen: Soft and nontender with well-healed incisions  Assessment and plan: Doing well follow Roux-en-Y gastric bypass with good initial weight loss and no complications. She will try to back here for followup in about 3 months.

## 2011-12-13 ENCOUNTER — Ambulatory Visit: Payer: Self-pay | Admitting: *Deleted

## 2012-01-01 ENCOUNTER — Other Ambulatory Visit: Payer: Self-pay | Admitting: Emergency Medicine

## 2012-01-01 IMAGING — US US ABDOMEN COMPLETE
1 series · 13 of 25 positions shown · non-contrast
Comparison: None.

CLINICAL DATA: Abdominal pain.  Question gallstones

COMPLETE ABDOMINAL ULTRASOUND

[Series 1: us abdomen complete · 0.35mm/px · 13 of 66 slices shown]
[im 1/66]
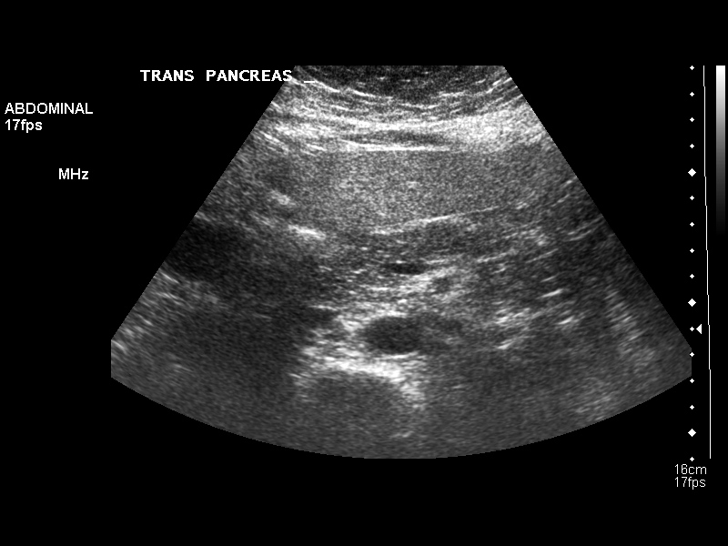
[im 6/66]
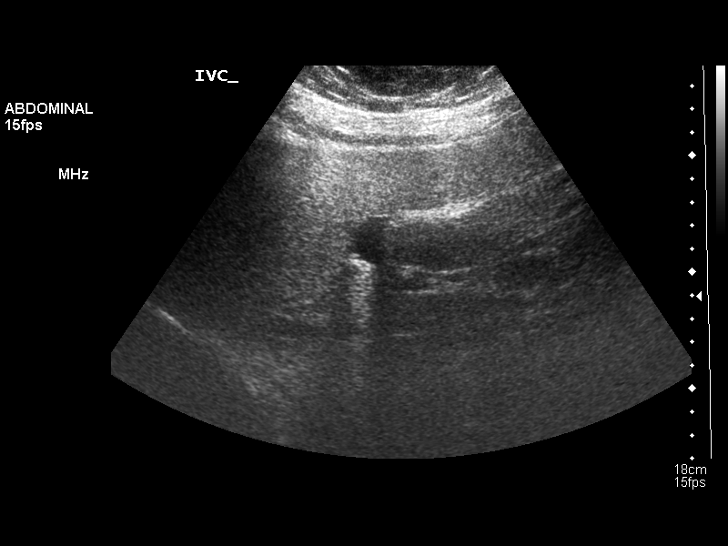
[im 11/66]
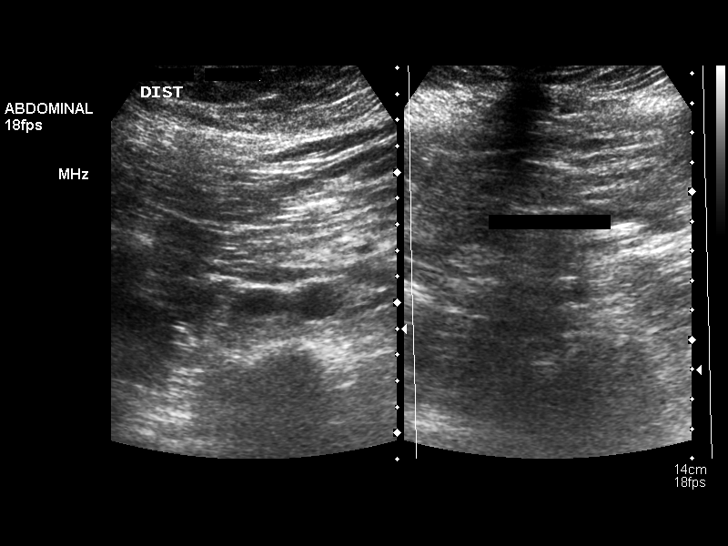
[im 17/66]
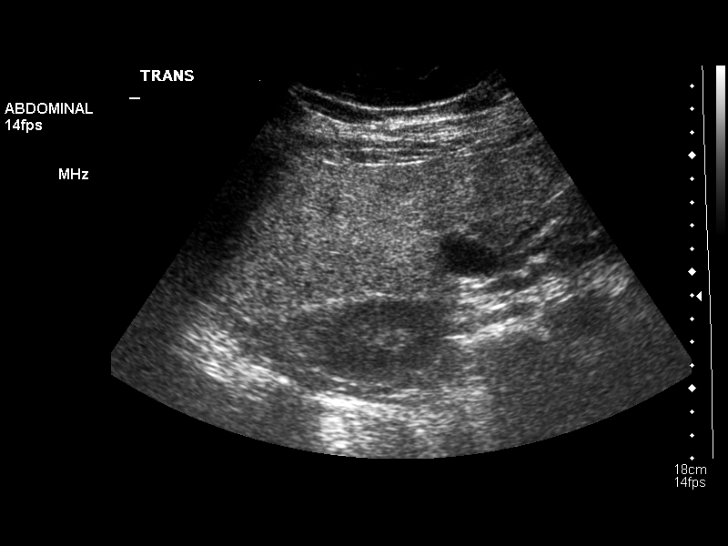
[im 22/66]
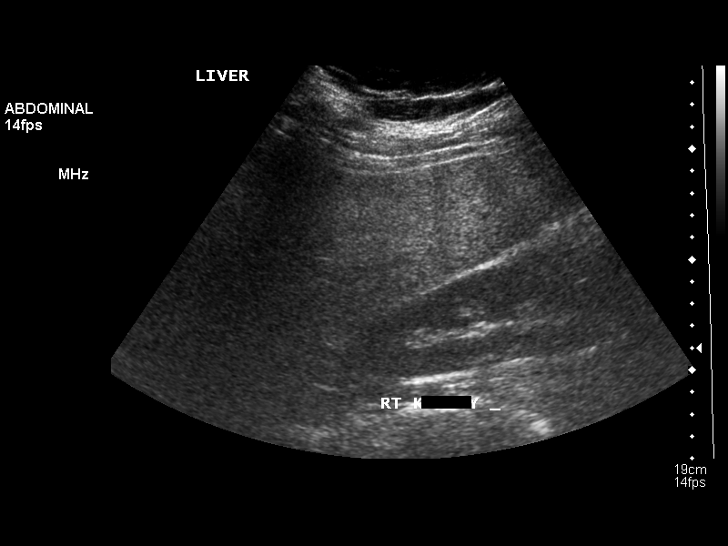
[im 28/66]
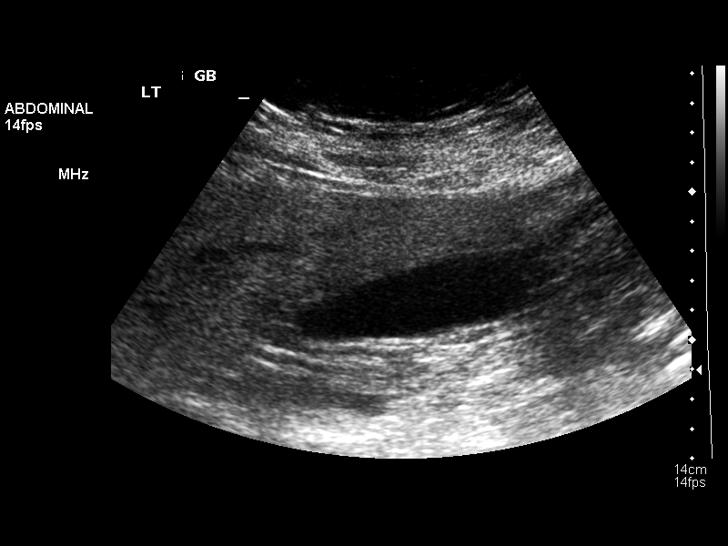
[im 33/66]
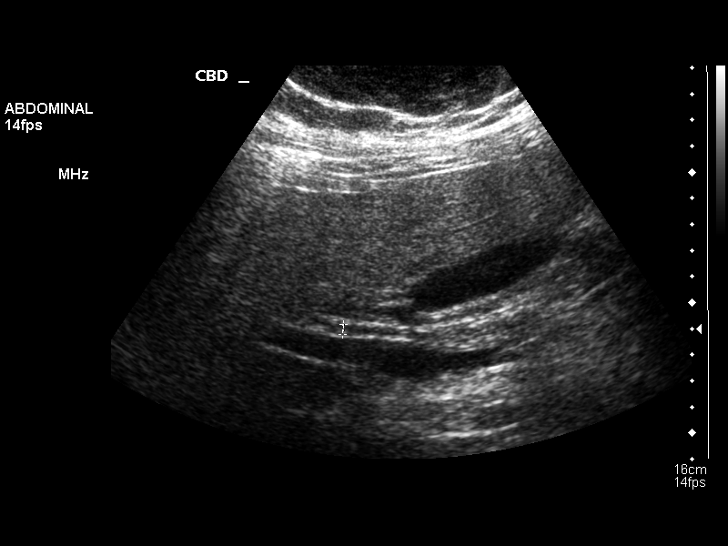
[im 38/66]
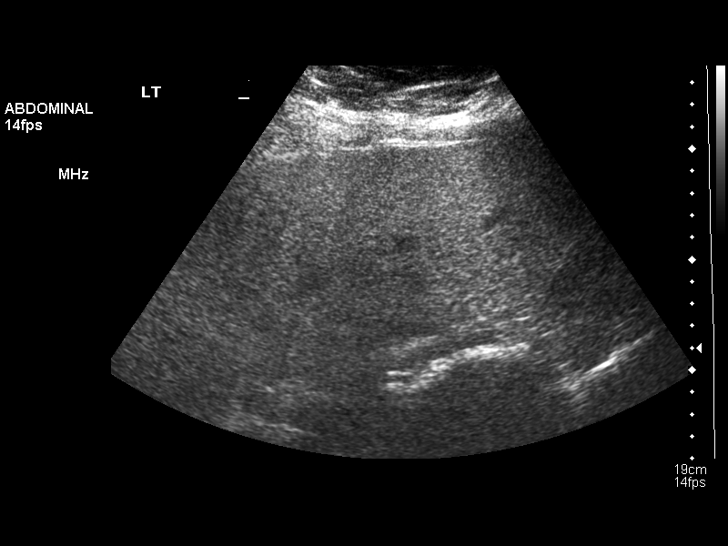
[im 44/66]
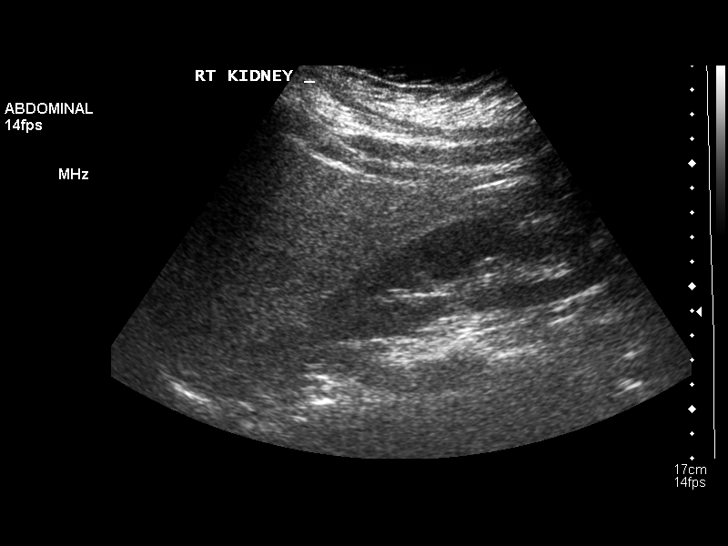
[im 49/66]
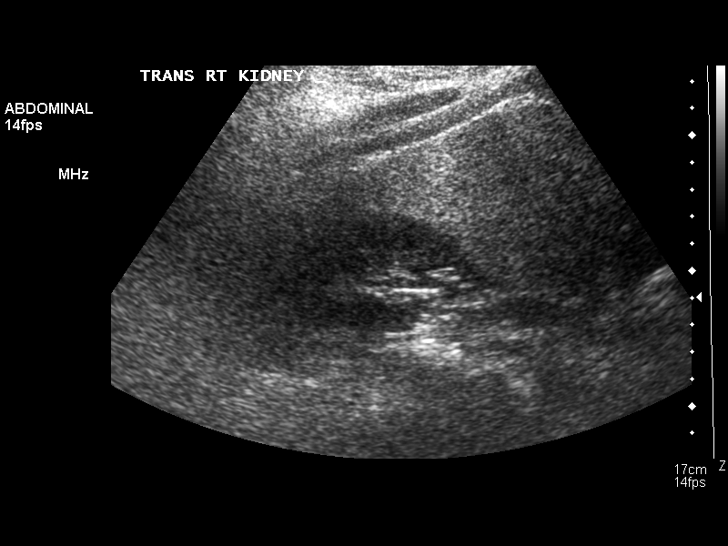
[im 55/66]
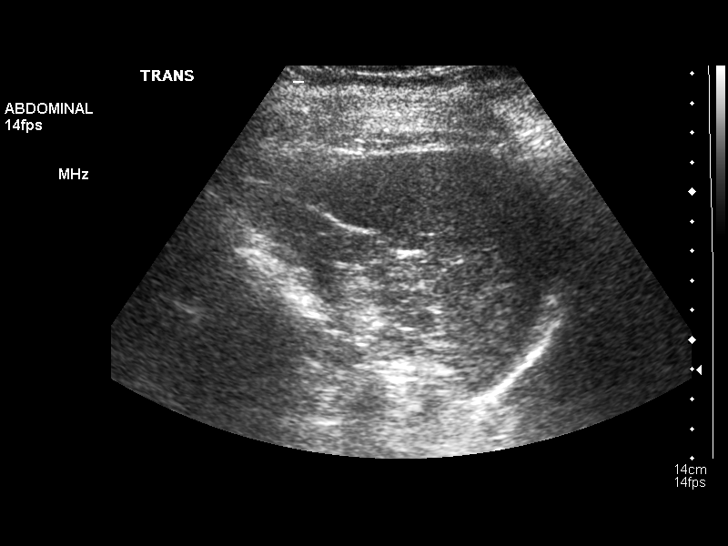
[im 60/66]
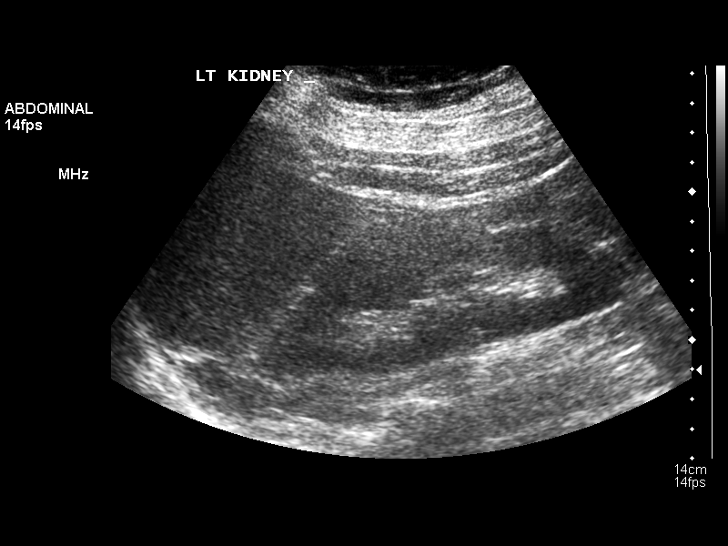
[im 66/66]
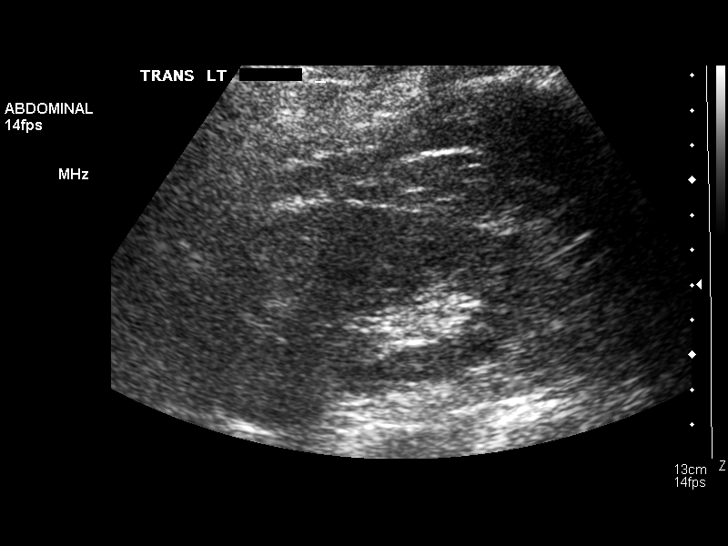

[13 of 25 positions shown; findings below may reference images not displayed]

FINDINGS: Gallbladder:  No gallstones, gallbladder wall thickening, or
pericholecystic fluid. Evaluation for a sonographic Murphy's sign
was negative.

Common bile duct:  Measures 3.9 mm in diameter and has a normal
appearance

Liver:  Demonstrates a diffuse increase in echotexture and some
obscuration of the hepatic ductal and vascular radicals.  This
would suggest underlying hepatic steatosis.  No focal parenchymal
abnormalities are seen.

IVC:  The proximal portion appears normal

Pancreas:  The pancreatic head and tail are both incompletely
assessed due to shadowing from overlying gas.  The body has a
normal appearance with no ductal dilatation seen

Spleen:  Has a sagittal length of 9.2 cm.  A homogeneous
parenchymal pattern is noted

Right Kidney:  Has a sagittal length of 13.1 cm.  No focal
parenchymal abnormality or signs of hydronephrosis are noted area

Left Kidney:  Has a sagittal length of 13.2 cm.  No focal
parenchymal abnormality or signs of hydronephrosis are noted

Abdominal aorta:  Has a maximal caliber of 2.5 cm with no evidence
for aneurysmal dilatation.
IMPRESSION: Findings suggestive of hepatic steatosis.

Incomplete pancreatic evaluation possible due to overlying gas.
Otherwise unremarkable.

## 2012-01-08 ENCOUNTER — Other Ambulatory Visit: Payer: Self-pay | Admitting: Emergency Medicine

## 2012-01-13 ENCOUNTER — Other Ambulatory Visit: Payer: Self-pay | Admitting: Emergency Medicine

## 2012-01-19 ENCOUNTER — Ambulatory Visit: Payer: BC Managed Care – PPO

## 2012-01-19 ENCOUNTER — Other Ambulatory Visit: Payer: Self-pay | Admitting: Emergency Medicine

## 2012-01-19 ENCOUNTER — Ambulatory Visit: Payer: BC Managed Care – PPO | Admitting: Emergency Medicine

## 2012-01-19 DIAGNOSIS — Z951 Presence of aortocoronary bypass graft: Secondary | ICD-10-CM

## 2012-01-19 DIAGNOSIS — Z9884 Bariatric surgery status: Secondary | ICD-10-CM

## 2012-01-19 DIAGNOSIS — R51 Headache: Secondary | ICD-10-CM

## 2012-01-19 DIAGNOSIS — J309 Allergic rhinitis, unspecified: Secondary | ICD-10-CM

## 2012-01-19 DIAGNOSIS — R930 Abnormal findings on diagnostic imaging of skull and head, not elsewhere classified: Secondary | ICD-10-CM

## 2012-01-19 LAB — POCT CBC
Granulocyte percent: 36.8 %G — AB (ref 37–80)
HCT, POC: 42.9 % (ref 37.7–47.9)
MID (cbc): 0.4 (ref 0–0.9)
MPV: 11.5 fL (ref 0–99.8)
POC Granulocyte: 2 (ref 2–6.9)
POC LYMPH PERCENT: 55.3 %L — AB (ref 10–50)
POC MID %: 7.9 %M (ref 0–12)
Platelet Count, POC: 279 10*3/uL (ref 142–424)
RDW, POC: 14.8 %

## 2012-01-19 LAB — COMPREHENSIVE METABOLIC PANEL
ALT: 63 U/L — ABNORMAL HIGH (ref 0–35)
AST: 41 U/L — ABNORMAL HIGH (ref 0–37)
Albumin: 4.5 g/dL (ref 3.5–5.2)
Alkaline Phosphatase: 127 U/L — ABNORMAL HIGH (ref 39–117)
Potassium: 4.1 mEq/L (ref 3.5–5.3)
Sodium: 140 mEq/L (ref 135–145)
Total Bilirubin: 0.6 mg/dL (ref 0.3–1.2)
Total Protein: 6.9 g/dL (ref 6.0–8.3)

## 2012-01-19 LAB — VITAMIN B12: Vitamin B-12: >2000 pg/mL — ABNORMAL HIGH (ref 211–911)

## 2012-01-19 LAB — POCT GLYCOSYLATED HEMOGLOBIN (HGB A1C): Hemoglobin A1C: 5

## 2012-01-19 MED ORDER — AMLODIPINE BESYLATE 5 MG PO TABS: 5.0000 mg | ORAL_TABLET | Freq: Every day | ORAL | Status: AC

## 2012-01-19 MED ORDER — FLUTICASONE PROPIONATE 50 MCG/ACT NA SUSP: 2.0000 | Freq: Every day | NASAL | Status: AC | PRN

## 2012-01-19 MED ORDER — ATENOLOL-CHLORTHALIDONE 100-25 MG PO TABS: 1.0000 | ORAL_TABLET | Freq: Every day | ORAL | Status: AC

## 2012-01-19 MED ORDER — POTASSIUM CHLORIDE CRYS ER 20 MEQ PO TBCR: 20.0000 meq | EXTENDED_RELEASE_TABLET | Freq: Every day | ORAL | Status: AC

## 2012-01-19 MED ORDER — AMOXICILLIN 250 MG/5ML PO SUSR: ORAL | Status: AC

## 2012-01-19 NOTE — Patient Instructions (Signed)
When you come back again in 3 months please call ahead of time and I will schedule CT of the head without contrast to evaluate the abnormality seen on your skull film.

## 2012-01-19 NOTE — Progress Notes (Signed)
  Subjective:    Patient ID: Madison Alvarado, female    DOB: [birth year], 51 y.o.   MRN: [id number]  HPI patient enters with a severe right-sided headache and pain in the right side of her face. She feels a lot of her symptoms are allergy related. She currently lives in [place] they have had a high pollen exposure there. She is also 2 months status post gastric bypass surgery and needs a basic labs following surgery.    Review of Systems     Objective:   Physical Exam  Constitutional: She is oriented to person, place, and time. She appears well-developed and well-nourished.  HENT:  Head: Normocephalic.  Right Ear: External ear normal.  Left Ear: External ear normal.  Eyes: Pupils are equal, round, and reactive to light.  Neck: No thyromegaly present.  Cardiovascular: Normal rate and regular rhythm.   Pulmonary/Chest: Effort normal and breath sounds normal.  Abdominal: Soft. Bowel sounds are normal.  Musculoskeletal: Normal range of motion.  Neurological: She is alert and oriented to person, place, and time. She has normal reflexes. No cranial nerve deficit. Coordination normal.    [facility] reading (PRIMARY) by  Dr. 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 There is thickening of the right maxillary sinus no air-fluid level.        Assessment & Plan:  Patient has an abnormal plain film of the sinus. 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 We'll go ahead and CT her head when she returns for a visit in 3-4 months. We will treat her sinuses with the Allegra prescription for amoxicillin and Flonase spray

## 2012-01-19 NOTE — Assessment & Plan Note (Signed)
This diagnosis

## 2012-02-01 ENCOUNTER — Encounter (INDEPENDENT_AMBULATORY_CARE_PROVIDER_SITE_OTHER): Payer: Self-pay | Admitting: General Surgery

## 2012-02-14 ENCOUNTER — Ambulatory Visit: Payer: BC Managed Care – PPO | Admitting: Emergency Medicine

## 2012-03-04 ENCOUNTER — Other Ambulatory Visit: Payer: Self-pay | Admitting: Emergency Medicine

## 2012-05-01 ENCOUNTER — Ambulatory Visit: Payer: BC Managed Care – PPO | Admitting: Emergency Medicine

## 2012-07-15 IMAGING — CR DG SINUSES COMPLETE 3+V
3 series · 3 of 3 positions shown · non-contrast
Comparison: None.

CLINICAL DATA: Headache with sinus pressure.

PARANASAL SINUSES - COMPLETE 3 + VIEW

[waters (1 of 2)]
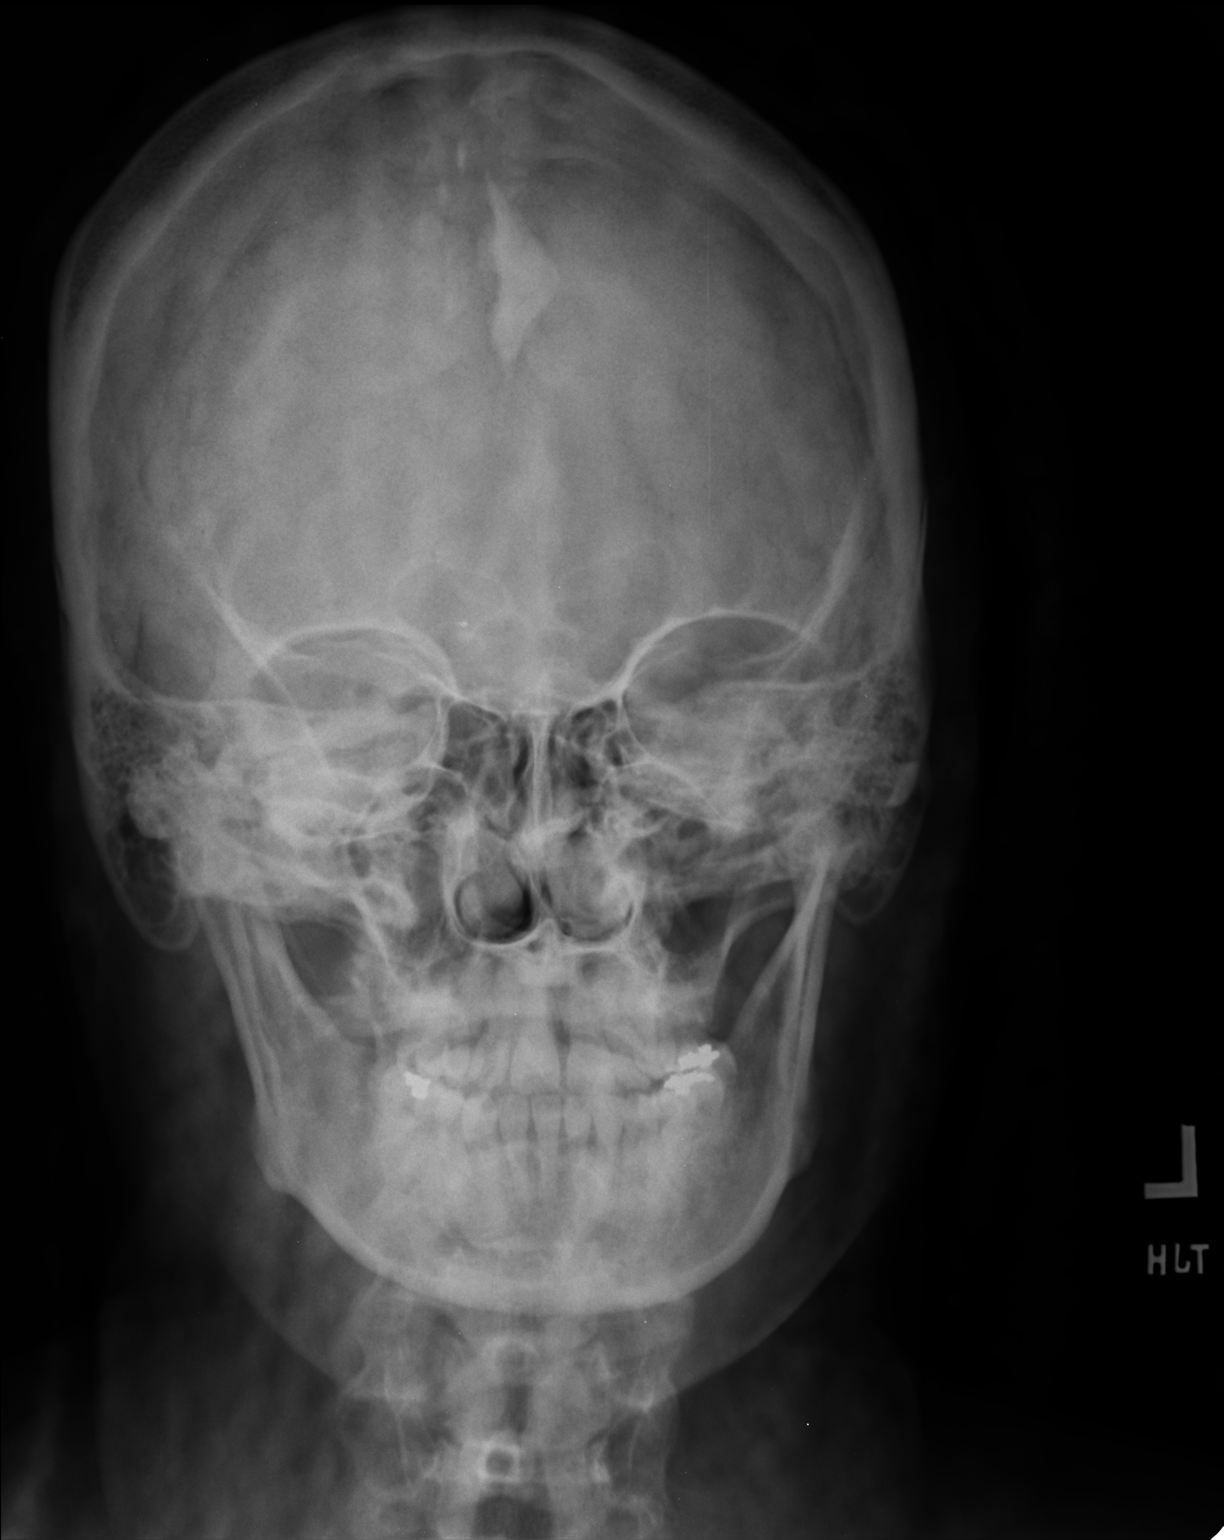

[waters (2 of 2)]
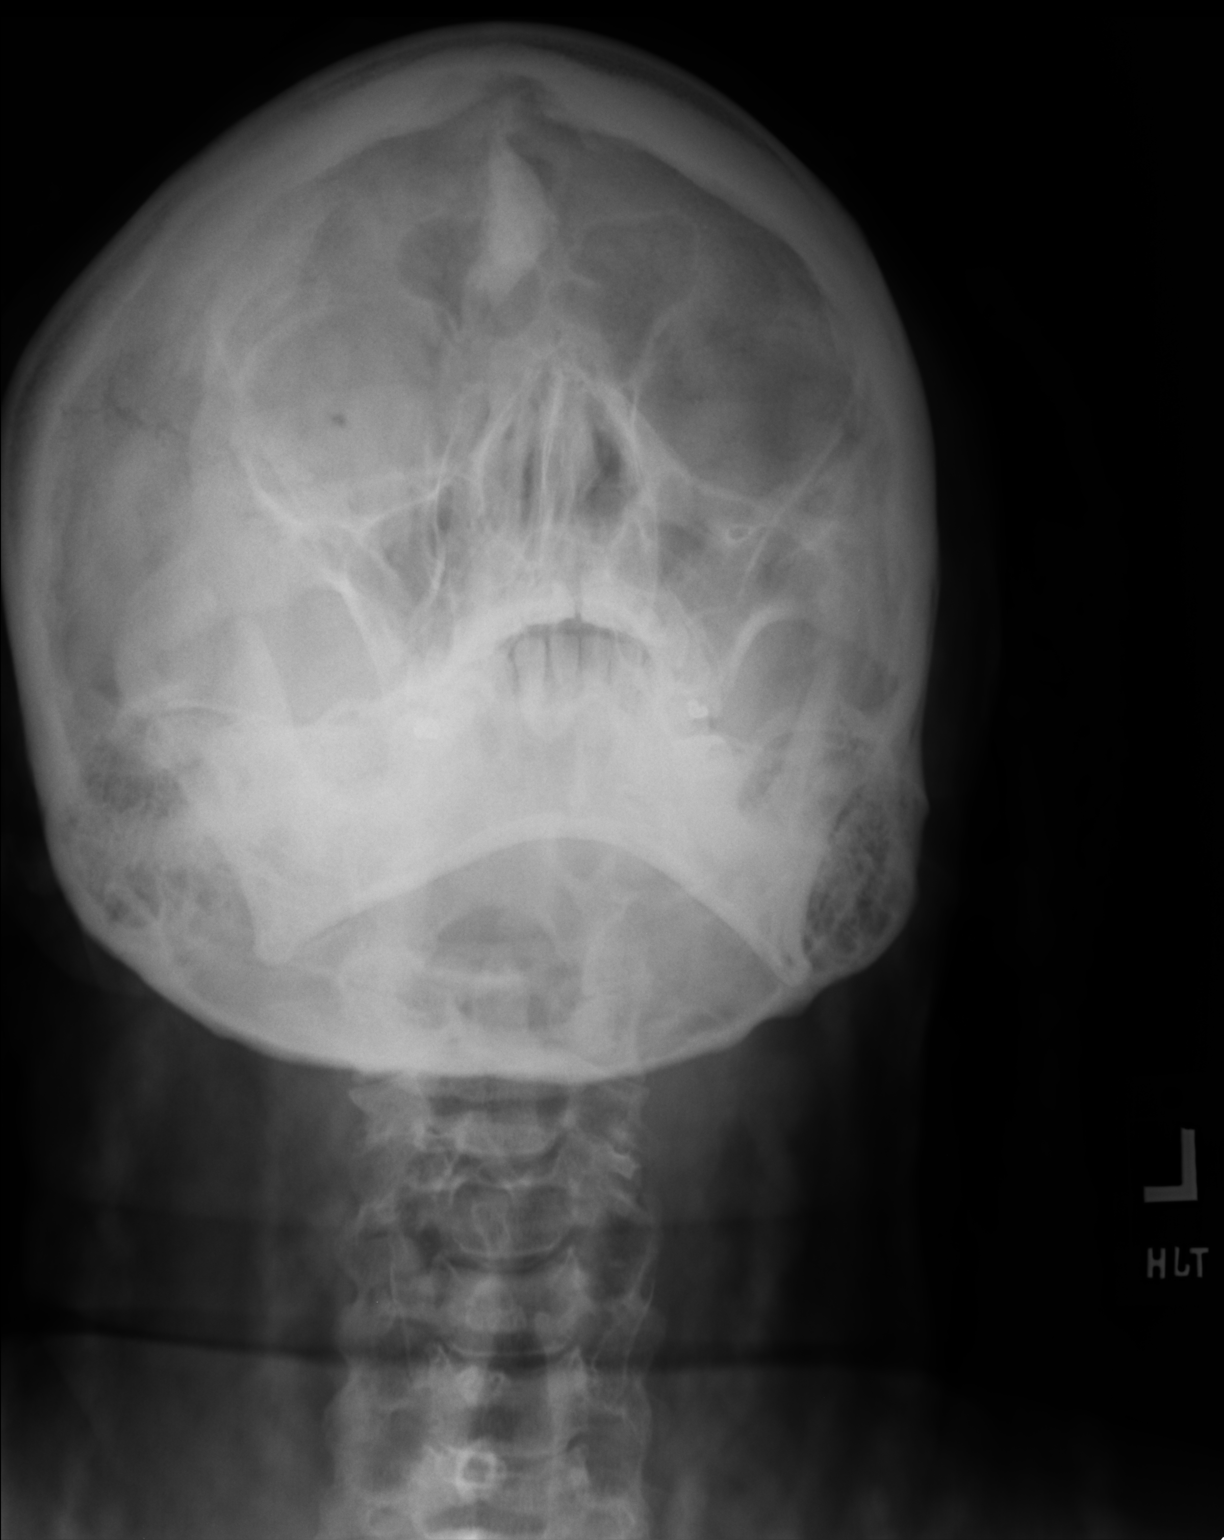

[lateral]
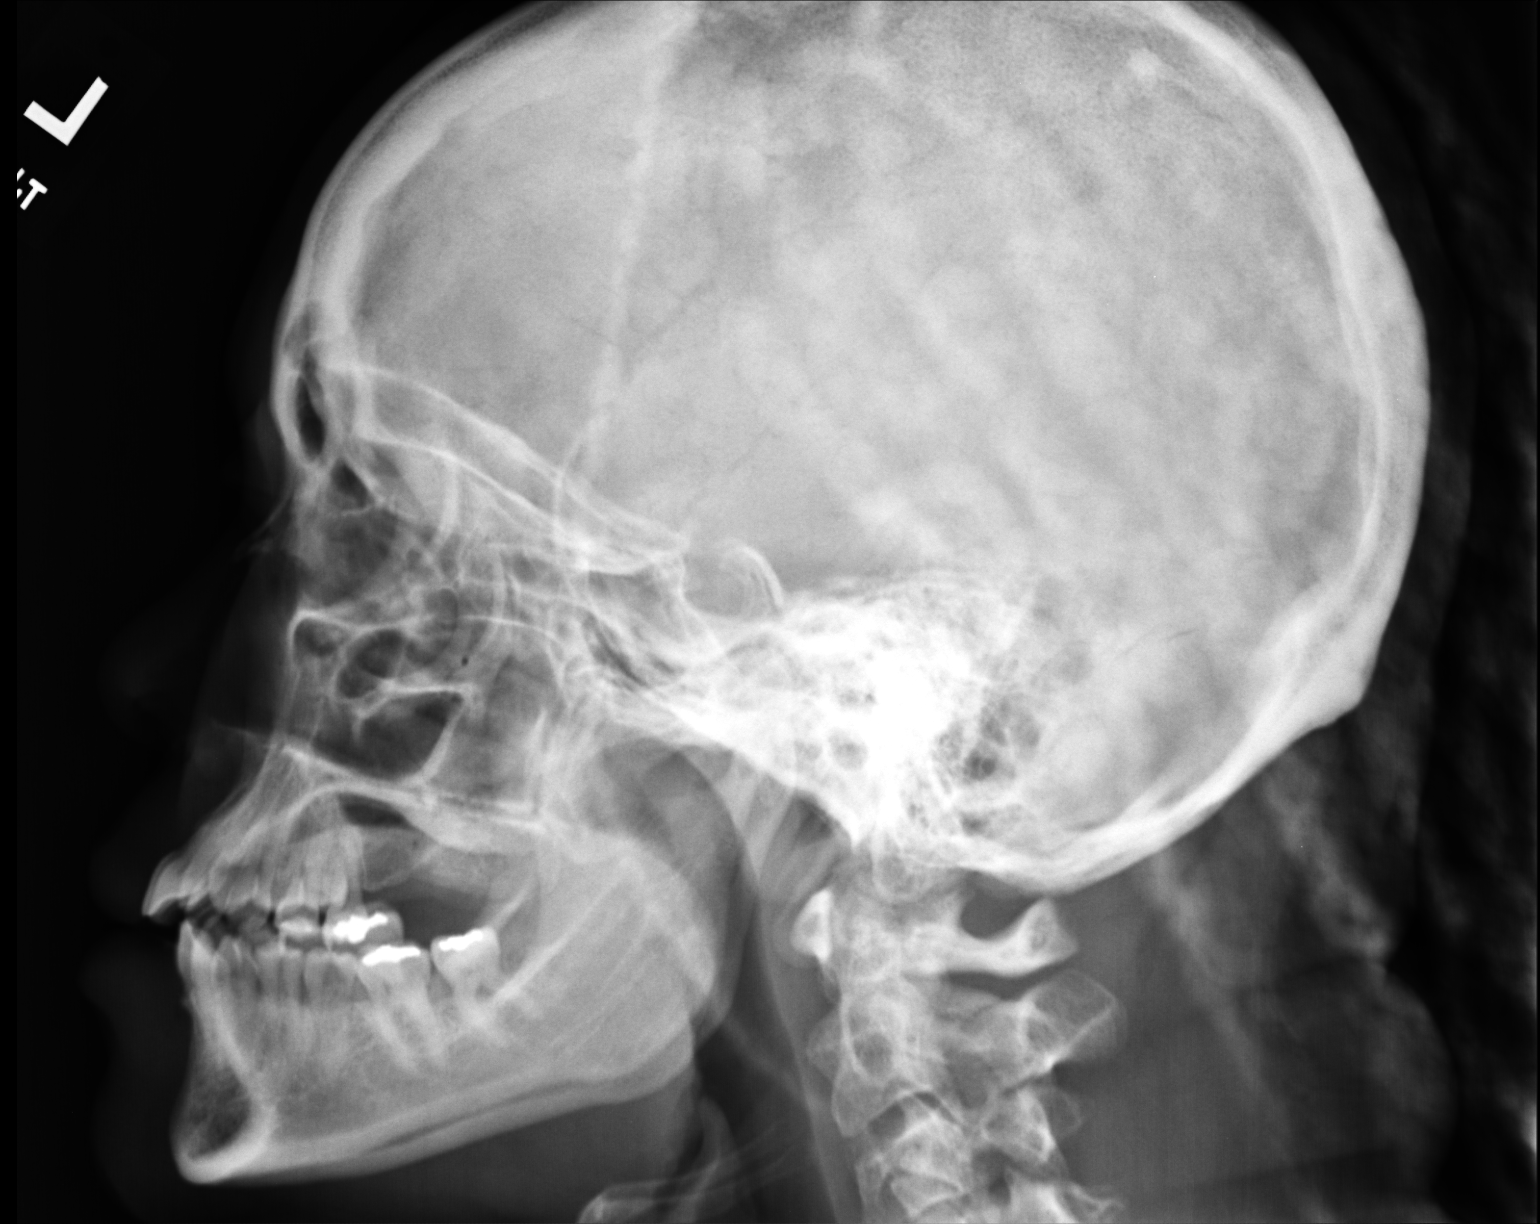

[3 of 3 positions shown; findings below may reference images not displayed]

FINDINGS: There is artifact related to the patient's overlying hair
braids. The visualized paranasal sinuses appear clear without air
fluid levels or definite mucosal thickening.  No acute osseous
findings are evident.  There is calcification of the falx cerebri.
IMPRESSION: No radiographic evidence of active sinus disease.

Clinically significant discrepancy from primary report, if
provided: None

## 2013-06-04 ENCOUNTER — Encounter (INDEPENDENT_AMBULATORY_CARE_PROVIDER_SITE_OTHER): Payer: Self-pay

## 2015-06-09 ENCOUNTER — Encounter: Payer: Self-pay | Admitting: Emergency Medicine

## 2017-05-15 ENCOUNTER — Encounter (HOSPITAL_COMMUNITY): Payer: Self-pay

## 2017-07-06 ENCOUNTER — Telehealth (HOSPITAL_COMMUNITY): Payer: Self-pay

## 2017-07-06 NOTE — Telephone Encounter (Signed)
This patient is overdue for recommended follow-up with a bariatric surgeon at [facility] A letter was originally mailed to the address on file from both [facility] in attempt to reestablish post-op care. [facility], forward time expired to send to new address. Sent letter out again today to new address provided from the post office: [address] [place], [place] [id number]

## 2018-04-30 ENCOUNTER — Encounter (HOSPITAL_COMMUNITY): Payer: Self-pay
